# Patient Record
Sex: Male | Born: 1950 | Race: White | Hispanic: No | Marital: Married | Smoking: Former smoker
Health system: Southern US, Community
[De-identification: ages and names within clinical notes are randomized; demographics above are authoritative.]

## PROBLEM LIST (undated history)

## (undated) DIAGNOSIS — I2510 Atherosclerotic heart disease of native coronary artery without angina pectoris: Secondary | ICD-10-CM

## (undated) DIAGNOSIS — I10 Essential (primary) hypertension: Secondary | ICD-10-CM

## (undated) DIAGNOSIS — I739 Peripheral vascular disease, unspecified: Secondary | ICD-10-CM

## (undated) DIAGNOSIS — C801 Malignant (primary) neoplasm, unspecified: Secondary | ICD-10-CM

## (undated) DIAGNOSIS — B182 Chronic viral hepatitis C: Secondary | ICD-10-CM

## (undated) HISTORY — PX: PALATE / UVULA BIOPSY / EXCISION: SUR128

## (undated) HISTORY — PX: CAROTID ENDARTERECTOMY: SUR193

---

## 2009-06-06 ENCOUNTER — Ambulatory Visit: Payer: Self-pay | Admitting: Gastroenterology

## 2009-06-19 ENCOUNTER — Ambulatory Visit: Payer: Self-pay | Admitting: Cardiovascular Disease

## 2009-10-08 ENCOUNTER — Encounter: Payer: Self-pay | Admitting: Anesthesiology

## 2009-10-21 ENCOUNTER — Encounter: Payer: Self-pay | Admitting: Anesthesiology

## 2009-11-14 ENCOUNTER — Ambulatory Visit: Payer: Self-pay | Admitting: Gastroenterology

## 2010-01-24 ENCOUNTER — Inpatient Hospital Stay: Payer: Self-pay | Admitting: Internal Medicine

## 2014-01-04 ENCOUNTER — Ambulatory Visit: Payer: Self-pay | Admitting: Vascular Surgery

## 2014-01-04 DIAGNOSIS — I10 Essential (primary) hypertension: Secondary | ICD-10-CM

## 2014-01-04 LAB — BASIC METABOLIC PANEL
Anion Gap: 7 (ref 7–16)
BUN: 9 mg/dL (ref 7–18)
CALCIUM: 8.8 mg/dL (ref 8.5–10.1)
Chloride: 102 mmol/L (ref 98–107)
Co2: 25 mmol/L (ref 21–32)
Creatinine: 1.22 mg/dL (ref 0.60–1.30)
EGFR (Non-African Amer.): >60
GLUCOSE: 109 mg/dL — AB (ref 65–99)
Osmolality: 268 (ref 275–301)
POTASSIUM: 4.5 mmol/L (ref 3.5–5.1)
Sodium: 134 mmol/L — ABNORMAL LOW (ref 136–145)

## 2014-01-04 LAB — CBC
HCT: 42.6 % (ref 40.0–52.0)
HGB: 14.6 g/dL (ref 13.0–18.0)
MCH: 35.6 pg — ABNORMAL HIGH (ref 26.0–34.0)
MCHC: 34.3 g/dL (ref 32.0–36.0)
MCV: 104 fL — ABNORMAL HIGH (ref 80–100)
PLATELETS: 153 10*3/uL (ref 150–440)
RBC: 4.11 10*6/uL — ABNORMAL LOW (ref 4.40–5.90)
RDW: 14.4 % (ref 11.5–14.5)
WBC: 5.8 10*3/uL (ref 3.8–10.6)

## 2014-01-04 LAB — URINALYSIS, COMPLETE
BILIRUBIN, UR: NEGATIVE
BLOOD: NEGATIVE
GLUCOSE, UR: NEGATIVE mg/dL (ref 0–75)
Ketone: NEGATIVE
Leukocyte Esterase: NEGATIVE
Nitrite: NEGATIVE
Ph: 7 (ref 4.5–8.0)
SPECIFIC GRAVITY: 1.014 (ref 1.003–1.030)
WBC UR: 4 /HPF (ref 0–5)

## 2014-01-11 ENCOUNTER — Inpatient Hospital Stay: Payer: Self-pay | Admitting: Vascular Surgery

## 2014-01-12 LAB — CBC WITH DIFFERENTIAL/PLATELET
BASOS ABS: 0.1 10*3/uL (ref 0.0–0.1)
Basophil %: 0.8 %
EOS PCT: 4.9 %
Eosinophil #: 0.4 10*3/uL (ref 0.0–0.7)
HCT: 38.2 % — AB (ref 40.0–52.0)
HGB: 12.8 g/dL — ABNORMAL LOW (ref 13.0–18.0)
Lymphocyte #: 2.3 10*3/uL (ref 1.0–3.6)
Lymphocyte %: 30.6 %
MCH: 35.8 pg — ABNORMAL HIGH (ref 26.0–34.0)
MCHC: 33.5 g/dL (ref 32.0–36.0)
MCV: 107 fL — ABNORMAL HIGH (ref 80–100)
MONOS PCT: 10.6 %
Monocyte #: 0.8 x10 3/mm (ref 0.2–1.0)
NEUTROS ABS: 3.9 10*3/uL (ref 1.4–6.5)
Neutrophil %: 53.1 %
Platelet: 123 10*3/uL — ABNORMAL LOW (ref 150–440)
RBC: 3.58 10*6/uL — ABNORMAL LOW (ref 4.40–5.90)
RDW: 14.2 % (ref 11.5–14.5)
WBC: 7.4 10*3/uL (ref 3.8–10.6)

## 2014-01-12 LAB — BASIC METABOLIC PANEL
Anion Gap: 7 (ref 7–16)
BUN: 15 mg/dL (ref 7–18)
CALCIUM: 7.8 mg/dL — AB (ref 8.5–10.1)
CO2: 26 mmol/L (ref 21–32)
Chloride: 101 mmol/L (ref 98–107)
Creatinine: 1.64 mg/dL — ABNORMAL HIGH (ref 0.60–1.30)
EGFR (Non-African Amer.): 44 — ABNORMAL LOW
GFR CALC AF AMER: 51 — AB
GLUCOSE: 104 mg/dL — AB (ref 65–99)
OSMOLALITY: 269 (ref 275–301)
Potassium: 4 mmol/L (ref 3.5–5.1)
Sodium: 134 mmol/L — ABNORMAL LOW (ref 136–145)

## 2014-01-12 LAB — PATHOLOGY REPORT

## 2014-01-12 LAB — PROTIME-INR
INR: 1.3
PROTHROMBIN TIME: 15.8 s — AB (ref 11.5–14.7)

## 2014-01-12 LAB — APTT: Activated PTT: 31.8 secs (ref 23.6–35.9)

## 2014-05-10 ENCOUNTER — Observation Stay: Payer: Self-pay | Admitting: Internal Medicine

## 2014-05-10 LAB — COMPREHENSIVE METABOLIC PANEL
ALBUMIN: 2.9 g/dL — AB (ref 3.4–5.0)
ALK PHOS: 151 U/L — AB
Anion Gap: 8 (ref 7–16)
BUN: 9 mg/dL (ref 7–18)
Bilirubin,Total: 0.4 mg/dL (ref 0.2–1.0)
CHLORIDE: 109 mmol/L — AB (ref 98–107)
CO2: 22 mmol/L (ref 21–32)
CREATININE: 1.23 mg/dL (ref 0.60–1.30)
Calcium, Total: 7.9 mg/dL — ABNORMAL LOW (ref 8.5–10.1)
EGFR (African American): >60
EGFR (Non-African Amer.): >60
Glucose: 150 mg/dL — ABNORMAL HIGH (ref 65–99)
Osmolality: 279 (ref 275–301)
POTASSIUM: 4 mmol/L (ref 3.5–5.1)
SGOT(AST): 109 U/L — ABNORMAL HIGH (ref 15–37)
SGPT (ALT): 73 U/L — ABNORMAL HIGH
SODIUM: 139 mmol/L (ref 136–145)
TOTAL PROTEIN: 7.4 g/dL (ref 6.4–8.2)

## 2014-05-10 LAB — LIPID PANEL
CHOLESTEROL: 79 mg/dL (ref 0–200)
HDL: 28 mg/dL — AB (ref 40–60)
LDL CHOLESTEROL, CALC: 32 mg/dL (ref 0–100)
Triglycerides: 96 mg/dL (ref 0–200)
VLDL CHOLESTEROL, CALC: 19 mg/dL (ref 5–40)

## 2014-05-10 LAB — PROTIME-INR
INR: 1.3
Prothrombin Time: 15.9 secs — ABNORMAL HIGH (ref 11.5–14.7)

## 2014-05-10 LAB — LIPASE, BLOOD: LIPASE: 328 U/L (ref 73–393)

## 2014-05-10 LAB — URINALYSIS, COMPLETE
BACTERIA: NONE SEEN
BLOOD: NEGATIVE
Bilirubin,UR: NEGATIVE
GLUCOSE, UR: NEGATIVE mg/dL (ref 0–75)
Ketone: NEGATIVE
Leukocyte Esterase: NEGATIVE
NITRITE: NEGATIVE
PH: 5 (ref 4.5–8.0)
Protein: NEGATIVE
RBC,UR: 1 /HPF (ref 0–5)
Specific Gravity: 1.018 (ref 1.003–1.030)
Squamous Epithelial: <1
WBC UR: 1 /HPF (ref 0–5)

## 2014-05-10 LAB — CBC WITH DIFFERENTIAL/PLATELET
BASOS ABS: 0.1 10*3/uL (ref 0.0–0.1)
Basophil %: 2.1 %
Eosinophil #: 0.4 10*3/uL (ref 0.0–0.7)
Eosinophil %: 9.5 %
HCT: 29.6 % — AB (ref 40.0–52.0)
HGB: 9.9 g/dL — AB (ref 13.0–18.0)
Lymphocyte #: 1.3 10*3/uL (ref 1.0–3.6)
Lymphocyte %: 31.5 %
MCH: 33.6 pg (ref 26.0–34.0)
MCHC: 33.5 g/dL (ref 32.0–36.0)
MCV: 100 fL (ref 80–100)
Monocyte #: 0.5 x10 3/mm (ref 0.2–1.0)
Monocyte %: 11.4 %
NEUTROS ABS: 1.9 10*3/uL (ref 1.4–6.5)
Neutrophil %: 45.5 %
Platelet: 164 10*3/uL (ref 150–440)
RBC: 2.96 10*6/uL — ABNORMAL LOW (ref 4.40–5.90)
RDW: 13.5 % (ref 11.5–14.5)
WBC: 4.2 10*3/uL (ref 3.8–10.6)

## 2014-05-10 LAB — D-DIMER(ARMC): D-DIMER: 609 ng/mL

## 2014-05-10 LAB — HEPARIN LEVEL (UNFRACTIONATED): Anti-Xa(Unfractionated): <0.1 IU/mL — ABNORMAL LOW (ref 0.30–0.70)

## 2014-05-10 LAB — CK-MB
CK-MB: 2.1 ng/mL (ref 0.5–3.6)
CK-MB: 2.2 ng/mL (ref 0.5–3.6)

## 2014-05-10 LAB — TROPONIN I
TROPONIN-I: 0.05 ng/mL
Troponin-I: 0.03 ng/mL
Troponin-I: 0.03 ng/mL

## 2014-05-10 LAB — ETHANOL: Ethanol: 77 mg/dL

## 2014-05-10 LAB — CK TOTAL AND CKMB (NOT AT ARMC)
CK, Total: 103 U/L
CK-MB: 1.6 ng/mL (ref 0.5–3.6)

## 2014-05-10 LAB — APTT: ACTIVATED PTT: 32.3 s (ref 23.6–35.9)

## 2014-06-08 ENCOUNTER — Ambulatory Visit
Admit: 2014-06-08 | Disposition: A | Discharge status: Home / Self Care | Payer: Self-pay | Admitting: Unknown Physician Specialty

## 2014-06-27 ENCOUNTER — Ambulatory Visit: Payer: Self-pay | Admitting: Unknown Physician Specialty

## 2014-09-19 ENCOUNTER — Ambulatory Visit: Payer: Self-pay | Admitting: Gastroenterology

## 2014-10-03 ENCOUNTER — Ambulatory Visit
Admit: 2014-10-03 | Disposition: A | Discharge status: Home / Self Care | Payer: Self-pay | Attending: Gastroenterology | Admitting: Gastroenterology

## 2014-10-09 ENCOUNTER — Ambulatory Visit
Admit: 2014-10-09 | Disposition: A | Discharge status: Home / Self Care | Payer: Self-pay | Attending: Vascular Surgery | Admitting: Vascular Surgery

## 2014-10-09 LAB — CREATININE, SERUM
Creatinine: 0.93 mg/dL
EGFR (African American): >60

## 2014-10-09 LAB — BUN: BUN: 12 mg/dL

## 2014-10-14 NOTE — Consult Note (Signed)
PATIENT NAME:  Jay Dougherty, Jay Dougherty MR#:  [id number] DATE OF BIRTH:  May 15, 1951  DATE OF CONSULTATION:  01/12/2014  REFERRING PHYSICIAN:   CONSULTING PHYSICIAN:  Otelia Limes. Yves Dill, MD  REQUESTING PHYSICIAN: Dr. Lucky Cowboy.  REASON FOR CONSULTATION: Urinary retention.   HISTORY OF PRESENT ILLNESS: Mr. Prude is a 64 year old, Caucasian Jay Dougherty, who underwent right carotid endarterectomy yesterday. Apparently during the procedure, he had a Foley catheter in place. He was not able to void and the catheter was replaced last night. Earlier today, he was not able to void and the catheter was replaced and removed. Prior to the current hospitalization, the patient does give a long history of difficulty voiding with a weak stream, straining, and nocturia. He has not had a recent DRE or PSA. He is currently not taking any medications for BPH.   ALLERGIES: No drug allergies.   CURRENT MEDICATIONS: Included aspirin, metoprolol, Colace, losartan, simvastatin, pantoprazole, diphenhydramine, Plavix, Nitrostat p.r.n., and Percocet.   PAST SURGICAL HISTORY:   1. Removal of uvula.  2. Coronary artery stent placement x 1.   SOCIAL HISTORY: The patient smokes half-a-pack-a-day and has greater than a 30-pack-year history. He is a recovered alcoholic. He quit drinking in 2011.   FAMILY HISTORY: Remarkable for heart disease and Alzheimer dementia.   PAST AND CURRENT MEDICAL CONDITIONS:  1. Coronary artery disease.  2. GERD. 3. Vascular disease.  4. Hypercholesterolemia.  5. Hypertension.   REVIEW OF SYSTEMS: The patient denies history of prior urological surgery, kidney stones or urinary tract infections.   PHYSICAL EXAMINATION:  GENERAL: Well-nourished white Jay Dougherty in no acute distress.  GASTROINTESTINAL: Abdomen was soft. Bladder was slightly distended.  GENITOURINARY: Circumcised. Testes were smooth and nontender, 18 mL size each.  RECTAL: 40-gram smooth nontender prostate.   PERTINENT LABORATORY STUDIES:  Include BUN of 15 and a creatinine of 1.64.   Bladder scan at 4 p.m. today indicated 200 mL present in the bladder.   IMPRESSION:  1. Postoperative urinary retention.  2. Benign prostatic hypertrophy.   PLAN:  1. Discussed options of leaving the Foley catheter in for 1 week versus intermittent self-catheterization. After a lengthy discussion with the patient and his wife, he has opted for self-catheterization. I brought in 12 self-catheters and instructed the patient and the wife in how to perform the procedure. Also provided written instructions and a catheterization-void diary to keep track of his progress.  2. Flomax 0.4 mg after supper. 3. Uribel 1 every 6 hours as needed for dysuria.  4. Drink plenty of fluids.  5. Return to the office in 1 week for follow-up.   ____________________________ Otelia Limes. Yves Dill, MD mrw:jr D: 01/12/2014 18:37:54 ET T: 01/12/2014 19:31:24 ET JOB#: [id number]  cc: Otelia Limes. Yves Dill, MD, <Dictator> Royston Cowper MD ELECTRONICALLY SIGNED 01/13/2014 8:29

## 2014-10-14 NOTE — Op Note (Signed)
PATIENT NAME:  Jay Dougherty, Jay Dougherty MR#:  [id number] DATE OF BIRTH:  Dec 21, 1950  DATE OF PROCEDURE:  01/11/2014  PREOPERATIVE DIAGNOSES:  1.  High-grade right carotid artery stenosis.  2.  Syncope.  3.  Coronary disease.  4.  Hypertension.  5.  Hepatitis C.   POSTOPERATIVE DIAGNOSES: 1.  High-grade right carotid artery stenosis.  2.  Syncope.  3.  Coronary disease.  4.  Hypertension.  5.  Hepatitis C.   PROCEDURE:  Right carotid endarterectomy with CorMatrix arterial patch reconstruction.   SURGEON: Leotis Pain, MD.  ASSISTANT:  Marta Lamas, MD.  ANESTHESIA: General.   ESTIMATED BLOOD LOSS: Approximately 100 mL.   INDICATION FOR PROCEDURE: This is a 64 year old gentleman with high-grade right carotid artery stenosis who has had recurrent syncopal episodes. He has had a cardiac work-up and previous cardiac treatment.  His cardiologist felt that his most likely cause of his syncope was his carotid disease. CT angiogram and ultrasound showed high-grade right carotid artery stenosis. He is brought in for repair. Risks and benefits were discussed. Informed consent was obtained.   DESCRIPTION OF PROCEDURE: The patient is brought to the operative suite and after an adequate level of general anesthesia obtained, the patient was placed in modified beach chair position. A roll was placed under her shoulders.  Neck was flexed and turned to the left. The skin was sterilely prepped and draped and a sterile surgical field was created. The incision was created along the anterior border of the sternocleidomastoid and I dissected down through the platysma with electrocautery. Two [place] retractors used to help facilitate our exposure. The carotid artery was dissected out.  The common carotid artery was a generous vessel.  This was encircled with vessel loops after the facial vein was ligated and divided between silk ties. The disease tracked a couple of centimeters beyond the bifurcation and I dissected out the  hypoglossal nerve protecting it from harm and ligated an artery and a vein branch in this area, dividing them between silk ties.  We were able then to dissect out the distal internal carotid artery beyond the lesion and encircled this with vessel loops. The superior thyroid artery and external carotid artery were encircled with vessel loops as well.  The patient was given 6000 units of intravenous heparin for systemic anticoagulation and control was then pulled up on the vessel loops and anterior wall arteriotomy was created with an 11 blade and extended with Potts scissors. The Pruitt-Inahara shunt was placed first, and the internal carotid artery flushed and de-aired, then in the common carotid artery flushed and de-aired, and then flow was restored to the brain.  Approximately two minutes elapsed from clamping to shunt placement. An endarterectomy was then performed in the typical fashion. The proximal endpoint was cut flush with tenotomy scissors. An eversion endarterectomy was performed on the external carotid artery. A nice feathered distal endpoint was created on the internal carotid artery. All loose flecks were removed and the vessel was locally heparinized. The CorMatrix external patch was then brought onto the field. It was cut and beveled distally and started at the proximal endpoint and run approximately 1/2 the length of the arteriotomy medially and laterally.  The patch was then cut and beveled to an appropriate length and a second 6-0 Prolene was started at the proximal endpoint. This was run medially first and tied to the other suture line. The lateral suture line was run until the shunt needed to be removed. Shunt was then removed. The  vessel was flushed in the internal, external and common carotid arteries and locally heparinized, and then the anastomosis was completed.  Several cardiac cycles allowed to traverse up the external carotid artery prior to releasing control and the vessel was flushed  and de-aired prior to release of control. Approximately 2 minutes elapsed from shunt removal to restoration of flow to the brain. Three 6-0 Prolene patches were used for hemostasis. Hemostasis was achieved. Evicel and Surgicel were placed. The wound was then closed with three 3-0 Vicryl sutures in the sternocleidomastoid space. The platysma was closed with a running 3-0 Vicryl and the skin was closed with 4-0 Monocryl. Dermabond was placed as the dressing. The patient was awakened from anesthesia and taken to the recovery room in stable condition having tolerated the procedure well.      ____________________________ Algernon Huxley, MD jsd:ts D: 01/11/2014 15:38:04 ET T: 01/11/2014 16:10:26 ET JOB#: [id number]  cc: Algernon Huxley, MD, <Dictator> Dionisio David, MD Doylene Bode, MD Algernon Huxley MD ELECTRONICALLY SIGNED 01/31/2014 14:10

## 2014-10-14 NOTE — Consult Note (Signed)
Pt seen and examined. Full consult to follow. Known hx of CAD, s/p cardiac  stent. On daily PPI and plavix since 2003. At one point, was diagnosed with Barrett's. However, last EGD by Dr. Dionne Milo in 2011 showed No evidence of Barrett's. Woke up from sleep last night with severe leg and chest pain. According to wife, more abdominal bloating and indigestion recently despite taking either protonix or dexilant. Cardiac cath done today showed no progression of his cardiac disease. Also, hx of chronic hepatitis C. Recalls trying triple therapy with interferon. Got violently sick and had to stop after a dose or two. Went to [facility] for referral. Told to check labs 1-2 per year and hold off on any treatment.  increasing protonix to bid. Either protonix bid or dexilant/ prevacid bid once discharged. Will need repeat EGD over the next month or so as outpt off plavix if no clinical improvement. Pt made aware that there are new Hep C treatments available that does not include interferon. That can be considered later as well if insurance approves. Will check back tomorrow. Thanks.  Electronic Signatures: Verdie Shire (MD)  (Signed on [phone] 16:25)  Authored  Last Updated: 08-QPY-19 16:25 by Verdie Shire (MD)

## 2014-10-14 NOTE — Consult Note (Signed)
PATIENT NAME:  Jay Dougherty, Jay Dougherty MR#:  [id number] DATE OF BIRTH:  Sep 13, 1950  DATE OF CONSULTATION:  05/10/2014  REFERRING PHYSICIAN:   CONSULTING PHYSICIAN:  Kelby Fam. Ewing Schlein, MD  INDICATION FOR CONSULT: Unstable angina.   HISTORY OF PRESENT ILLNESS: This is a 64 year old Caucasian male, well known to our practice with a past medical history of coronary artery disease, status post PCI, peripheral vascular disease, status post right CEA this year, hypertension, Barrett esophagus, and Hepatitis C.   The patient states chest pain started last night. He had severe leg pain secondary to his peripheral vascular disease, which awoke him, which was followed by severe chest pain and patient collapsed. He denies loss of consciousness, but was unable to move after he collapsed. He describes pain as substernal and left-sided pressure-type, with concurrent nausea and shortness of breath. He is still having severe chest pain this morning.   PAST MEDICAL HISTORY: Coronary artery disease status post PCI to LAD in [place] in 2003, peripheral vascular disease status post right CEA in July 2015, and he also has lower extremity peripheral vascular disease, hypertension, Barrett esophagus, history of Hepatitis C.   HOME MEDICATIONS: Aspirin 81 mg, Cozaar 50 mg, Lipitor 80 mg, metoprolol succinate 50 mg, Nitrostat 0.4 mg p.r.n. chest pain, pantoprazole 40 mg daily, Plavix 75 mg daily, Pletal 100 mg b.i.d.   SOCIAL HISTORY: Former tobacco user. Social alcohol use. No other illicit drug use. Married. Works as occupation Animal nutritionist.   ALLERGIES: No known drug allergies.   FAMILY HISTORY: Father had coronary artery disease.   REVIEW OF SYSTEMS:  SYSTEMIC: Positive for fatigue and malaise.  CARDIOVASCULAR: Positive for chest pain. No palpitations or tachycardia.  PULMONARY: Positive for shortness of breath. No cough or wheezing.  GASTROINTESTINAL: Positive for feeling bloated, but  denies pain.   PHYSICAL EXAMINATION:  VITAL SIGNS: Blood pressure 190/82, pulse 92, temperature 98.3 degrees, respirations 17, pulse oximetry 94% saturation on room air.  GENERAL: Overall, patient is A and O x 3, in moderate distress.  LUNGS: Clear to auscultation bilaterally. No wheezes, rhonchi or rales.  CARDIOVASCULAR: Normal S1, S2. No audible murmur.  ABDOMEN: Nontender. Positive bowel sounds.  EXTREMITIES: No pedal edema.   LABORATORY DATA: Creatinine 1.23. First troponin was 0.03. White blood cells 4.2, platelets 164,000. INR 1.3.   EKG shows sinus tachycardia, 103 beats per minute, with preventricular contractions and left atrial enlargement.   ASSESSMENT AND PLAN: The patient with known coronary artery disease and unstable angina. He did have a CTA of his coronary early this year that showed the stent in the LAD was patent, but had moderate to severe stenosis distal to the LAD stent. Left circumflex had moderate calcified, diffuse disease. Calcium score was 2606. Advise continuing aspirin, Plavix, beta blocker and statin. Await echo results. Plan for cardiac catheterization today.   Thank you very much for this consult.    DICTATED FOR: Dionisio David, MD   ____________________________ Kelby Fam Dorena Cookey, PA-C ear:MT D: 05/10/2014 10:37:00 ET T: 05/10/2014 11:00:59 ET JOB#: [id number]  Dyann Ruddle A Mekhia Brogan PA ELECTRONICALLY SIGNED 05/27/2014 16:46

## 2014-10-14 NOTE — Consult Note (Signed)
Brief Consult Note: Diagnosis: Post-op urinary retention. BPH.   Patient was seen by consultant.   Consult note dictated.   Recommend to proceed with surgery or procedure.   Recommend further assessment or treatment.   Orders entered.   Comments: Flomax. Uribel. CIC tid and void/cath diary. F/U in office in one week.  Electronic Signatures: Royston Cowper (MD)  (Signed 23-Jul-15 18:25)  Authored: Brief Consult Note   Last Updated: 23-Jul-15 18:25 by Royston Cowper (MD)

## 2014-10-14 NOTE — Consult Note (Signed)
PATIENT NAME:  Jay Dougherty, Jay Dougherty MR#:  [id number] DATE OF BIRTH:  [birth year]  DATE OF CONSULTATION:  05/10/2014  REFERRING PHYSICIAN:   CONSULTING PHYSICIAN:  Lupita Dawn. Zebadiah Willert, MD  REASON FOR REFERRAL: Noncardiac chest pain.   HISTORY OF PRESENT ILLNESS: The patient is a 64 year old, white male, with a known history of coronary artery disease, who had a cardiac stent placed in 2003. He also has a known history of reflux disease and history of Barrett esophagus. He has been on a daily proton pump inhibitor and Plavix ever since 2003. One time, he was on Nexium. Recently he has been on Protonix, although the wife mentioned, he might be on Dexilant. He has had a couple upper endoscopies done in the past; however, the last upper endoscopy done by Dr. Dionne Milo in 2011 showed no evidence of Barrett esophagus.   He was doing fairly well until he woke up from sleep last night with rather severe leg pain, then followed by chest pain. As a result, he came to the Emergency Room for further evaluation. According to the wife, he has been having more abdominal bloating and indigestion symptoms recently despite taking the reflux medication. Fortunately, the cardiac catheterization done today showed no progression of his cardiac disease. As a result, I was asked to see the patient.   He also has a history of chronic hepatitis C. He recalls trying triple therapy with interferon. Unfortunately, even after 1 or 2 doses, he got violently sick to the point that he had to be admitted to the hospital. He then went to [place] and saw Dr. Maceo Pro. At the time, he was told to just hold off on treatment, but to have laboratories checked once to twice a year.   MEDICATIONS HE WAS ON  INCLUDE: Imdur, Toprol, nitroglycerin ointment, Protonix once a day, Zocor and baby aspirin, as well as Plavix. Home medication also includes Pletal 100 mg twice a day.   OTHER PAST MEDICAL HISTORY:  1. Stent placement in 2003. 2. Peripheral  vascular disease.   SOCIAL HISTORY: He used to smoke but quit. He drinks alcohol occasionally.   ALLERGIES: He has no known drug allergies.   FAMILY HISTORY: Coronary artery disease.   REVIEW OF SYSTEMS: Please refer to the initial history and physical. There are no changes.   PHYSICAL EXAMINATION:  GENERAL: The patient appears to be in no acute distress right now.  HEAD AND NECK: Within normal limits.  CARDIAC: Regular rate.  LUNGS: Clear bilaterally.  ABDOMEN: Normoactive bowel sounds, soft and nontender. There is no hepatomegaly.  EXTREMITIES: Showed no clubbing, cyanosis or edema.   LABORATORY DATA: Electrolytes are all normal. Glucose 150, lipase was 328, alcohol level was  77. Liver enzymes showed alkaline phosphatase 151, AST 109, ALT 73. CPK enzymes were normal. Hemoglobin 9.9. White count is 4.2.   IMPRESSION: This is a patient with acute chest pain, which appears to be noncardiac in nature. It could be related to worsening of the reflux disease. I recommended that he would go on Protonix twice a day with Dexilant twice a day when he gets discharged. He will need a repeat upper endoscopy, but it will need to be scheduled in the next few weeks as an outpatient. He will need to be off the Plavix before we can do this safely. We will need to keep an eye on his liver enzymes to make sure there is nothing wrong with his gallbladder or liver that could have contributed to the pain.  Thank you for the referral.    ____________________________ Lupita Dawn. Candace Cruise, MD pyo:JT D: 05/11/2014 11:12:12 ET T: 05/11/2014 11:44:24 ET JOB#: [id number]  cc: Lupita Dawn. Candace Cruise, MD, <Dictator> Lupita Dawn Paraskevi Funez MD ELECTRONICALLY SIGNED 05/11/2014 14:55

## 2014-10-16 LAB — SURGICAL PATHOLOGY

## 2014-10-22 NOTE — Op Note (Signed)
PATIENT NAME:  Jay Dougherty, Jay Dougherty MR#:  [id number] DATE OF BIRTH:  [birth year]  DATE OF PROCEDURE:  10/09/2014  PREOPERATIVE DIAGNOSES:  1.  Ischemic colitis.  2.  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  3.  Peripheral arterial disease with claudication.  4.  Tobacco dependence.  5.  Coronary disease.  6.  Hypertension.  7.  Carotid artery stenosis.   POSTOPERATIVE DIAGNOSIS:  1.  Ischemic colitis.  2.  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  3.  Peripheral arterial disease with claudication.  4.  Tobacco dependence.  5.  Coronary disease.  6.  Hypertension.  7.  Carotid artery stenosis.   PROCEDURES:  1.  Ultrasound guidance for vascular access to right femoral artery.  2.  Catheter placement into celiac, superior mesenteric artery, and inferior mesenteric artery from right femoral approach.  3.  Aortogram and selective celiac, superior mesenteric artery, and inferior mesenteric artery arteriograms.  4.  Percutaneous transluminal angioplasty of the inferior mesenteric artery with a 4 mm diameter angioplasty balloon.  5.  StarClose closure device right femoral artery.   SURGEON:  Algernon Huxley, M.D.   ANESTHESIA:  Local with moderate conscious sedation.   ESTIMATED BLOOD LOSS:  25 mL   CONTRAST USED:  80 mL of Visipaque.   INDICATION FOR PROCEDURE:  This is a 64 year old gentleman with a long history of atherosclerotic disease on many medications.  He has already had carotid endarterectomy.  He has coronary disease.  He has lower extremity peripheral arterial disease.  He had a severe bout of left-sided and transverse colitis.  This was felt to be likely ischemic in nature.  A CT scan was performed which I have reviewed.  This was a limited study and not fully able to evaluate the visceral  vessels, but there appeared to be significant atherosclerotic calcification of the celiac and SMA, as well as a small IMA which appeared narrowed on the CT scan.  Again, this was a somewhat limited study, but did get a suggestion that large vessel vascular disease may be present.  For this reason, an angiogram was performed for further evaluation for diagnosis as well as potential treatment if significant large vessel disease was identified.  Risks and benefits were discussed.  Informed consent was obtained.   DESCRIPTION OF PROCEDURE:  The patient is brought to the vascular suite.   His groins were sterilely prepped and draped and a sterile surgical field was created.  The right femoral artery was visualized with ultrasound and found to be widely patent.  Although quite calcific, it was accessed under direct ultrasound guidance without difficulty with a Seldinger needle and a permanent image was recorded.  A J-wire and 5-French sheath were then placed.  The patient was given a small dose of intravenous heparin.  Pigtail catheter was placed in the aorta at the T12-L1 level initially and an AP and lateral projection aortogram was performed.  I then used a C2 catheter to initially cannulate the celiac artery and perform AP and lateral imaging as well as cannulating the SMA with the C2 catheter and lateral and an AP projections were performed.  These demonstrated calcific lesions of the celiac and superior mesenteric artery without significant stenosis of more than 20% in either vessel with brisk flow through the normal vascular branches of both the celiac and superior mesenteric  artery.  No intervention was required on these lesions, and after cannulating both vessels and imaging them thoroughly, we turned our attention to evaluating the IMA.  A steep RAO projection aortogram was performed with pigtail catheter replaced in the infrarenal aorta.  This did show what appeared to be at least a moderate stenosis in the  first 2 to 3 cm of the IMA before its primary branches.  On the original aortogram, this appeared to be in the 60 to 70% range.  A VS-1 catheter was used to then selectively cannulate the IMA and selective imaging was performed confirming this at least  moderate degree of stenosis that I would estimate in the 65 to 70% range.  With this in place and with a history of ischemic colitis, I felt treatment was appropriate.  We had already imaged all 3 visceral vessels and this was the only significant lesion of the 3.  A V-18 wire was parked in the distal IMA well beyond the primary and secondary branches.  Over this wire, a 4 mm diameter x 4 cm length angioplasty balloon was taken out the proximal IMA with the back portion of the balloon just into the aorta.  The balloon was inflated to approximately 8 to 10 atmospheres.  A waste was seen which resolved with angioplasty.  The balloon inflation was held for approximately 1 minute.  The balloon was then removed.  A 4-French glide catheter was then placed back in the proximal IMA and completion imaging was performed which showed significant improvement with less than 20% residual stenosis after angioplasty and brisk flow through the IMA branches.  At this point I elected to terminate the procedure.  The diagnostic catheter was removed.  Oblique arteriogram was performed of the right femoral artery and StarClose closure device was deployed in the usual fashion with excellent hemostatic result.  The patient tolerated the procedure well and was taken to the recovery room in stable condition.    ____________________________ Algernon Huxley, MD [id number] D: 10/09/2014 15:27:32 ET T: 10/09/2014 19:19:15 ET JOB#: [id number]  cc: Algernon Huxley, MD, <Dictator> Venetia Maxon. Elijio Miles, MD Algernon Huxley MD ELECTRONICALLY SIGNED 10/12/2014 12:54

## 2015-09-18 ENCOUNTER — Other Ambulatory Visit: Payer: Self-pay | Admitting: Vascular Surgery

## 2015-09-20 ENCOUNTER — Other Ambulatory Visit
Admission: RE | Admit: 2015-09-20 | Discharge: 2015-09-20 | Disposition: A | Discharge status: Home / Self Care | Payer: 59 | Source: Ambulatory Visit | Attending: Vascular Surgery | Admitting: Vascular Surgery

## 2015-09-20 DIAGNOSIS — I739 Peripheral vascular disease, unspecified: Secondary | ICD-10-CM | POA: Insufficient documentation

## 2015-09-20 DIAGNOSIS — Z01812 Encounter for preprocedural laboratory examination: Secondary | ICD-10-CM | POA: Diagnosis present

## 2015-09-20 LAB — CREATININE, SERUM
CREATININE: 1.26 mg/dL — AB (ref 0.61–1.24)
GFR calc Af Amer: >60 mL/min (ref >60)
GFR calc non Af Amer: 58 mL/min — ABNORMAL LOW (ref >60)

## 2015-09-20 LAB — BUN: BUN: 15 mg/dL (ref 6–20)

## 2015-09-24 ENCOUNTER — Encounter: Payer: Self-pay | Admitting: *Deleted

## 2015-09-24 ENCOUNTER — Ambulatory Visit
Admission: RE | Admit: 2015-09-24 | Discharge: 2015-09-24 | Disposition: A | Discharge status: Home / Self Care | Payer: 59 | Source: Ambulatory Visit | Attending: Vascular Surgery | Admitting: Vascular Surgery

## 2015-09-24 ENCOUNTER — Encounter
Admission: RE | Disposition: A | Discharge status: Home / Self Care | Payer: Self-pay | Source: Ambulatory Visit | Attending: Vascular Surgery

## 2015-09-24 DIAGNOSIS — I10 Essential (primary) hypertension: Secondary | ICD-10-CM | POA: Insufficient documentation

## 2015-09-24 DIAGNOSIS — F17200 Nicotine dependence, unspecified, uncomplicated: Secondary | ICD-10-CM | POA: Diagnosis not present

## 2015-09-24 DIAGNOSIS — I771 Stricture of artery: Secondary | ICD-10-CM | POA: Insufficient documentation

## 2015-09-24 DIAGNOSIS — E785 Hyperlipidemia, unspecified: Secondary | ICD-10-CM | POA: Diagnosis not present

## 2015-09-24 DIAGNOSIS — Z8249 Family history of ischemic heart disease and other diseases of the circulatory system: Secondary | ICD-10-CM | POA: Insufficient documentation

## 2015-09-24 DIAGNOSIS — K559 Vascular disorder of intestine, unspecified: Secondary | ICD-10-CM | POA: Diagnosis not present

## 2015-09-24 DIAGNOSIS — Z823 Family history of stroke: Secondary | ICD-10-CM | POA: Insufficient documentation

## 2015-09-24 DIAGNOSIS — I2510 Atherosclerotic heart disease of native coronary artery without angina pectoris: Secondary | ICD-10-CM | POA: Diagnosis not present

## 2015-09-24 DIAGNOSIS — Z8342 Family history of familial hypercholesterolemia: Secondary | ICD-10-CM | POA: Diagnosis not present

## 2015-09-24 DIAGNOSIS — I6523 Occlusion and stenosis of bilateral carotid arteries: Secondary | ICD-10-CM | POA: Insufficient documentation

## 2015-09-24 DIAGNOSIS — Z7982 Long term (current) use of aspirin: Secondary | ICD-10-CM | POA: Insufficient documentation

## 2015-09-24 DIAGNOSIS — I739 Peripheral vascular disease, unspecified: Secondary | ICD-10-CM | POA: Diagnosis not present

## 2015-09-24 DIAGNOSIS — I70218 Atherosclerosis of native arteries of extremities with intermittent claudication, other extremity: Secondary | ICD-10-CM | POA: Insufficient documentation

## 2015-09-24 DIAGNOSIS — Z955 Presence of coronary angioplasty implant and graft: Secondary | ICD-10-CM | POA: Insufficient documentation

## 2015-09-24 DIAGNOSIS — Z79899 Other long term (current) drug therapy: Secondary | ICD-10-CM | POA: Diagnosis not present

## 2015-09-24 DIAGNOSIS — M79609 Pain in unspecified limb: Secondary | ICD-10-CM | POA: Diagnosis not present

## 2015-09-24 DIAGNOSIS — B1920 Unspecified viral hepatitis C without hepatic coma: Secondary | ICD-10-CM | POA: Diagnosis not present

## 2015-09-24 DIAGNOSIS — I70219 Atherosclerosis of native arteries of extremities with intermittent claudication, unspecified extremity: Secondary | ICD-10-CM | POA: Insufficient documentation

## 2015-09-24 HISTORY — PX: PERIPHERAL VASCULAR CATHETERIZATION: SHX172C

## 2015-09-24 HISTORY — DX: Peripheral vascular disease, unspecified: I73.9

## 2015-09-24 HISTORY — DX: Essential (primary) hypertension: I10

## 2015-09-24 HISTORY — DX: Atherosclerotic heart disease of native coronary artery without angina pectoris: I25.10

## 2015-09-24 SURGERY — ABDOMINAL AORTOGRAM W/LOWER EXTREMITY
Site: Leg Lower | Wound class: Clean

## 2015-09-24 MED ORDER — IOPAMIDOL (ISOVUE-300) INJECTION 61%
INTRAVENOUS | Status: DC | PRN
  Administered 2015-09-24: 110 mL via INTRA_ARTERIAL

## 2015-09-24 MED ORDER — LABETALOL HCL 5 MG/ML IV SOLN
10.0000 mg | INTRAVENOUS | Status: DC | PRN
Start: 2015-09-24 — End: 2015-09-24

## 2015-09-24 MED ORDER — SODIUM CHLORIDE 0.9 % IV SOLN
INTRAVENOUS | Status: DC
  Administered 2015-09-24: 08:00:00 via INTRAVENOUS

## 2015-09-24 MED ORDER — GUAIFENESIN-DM 100-10 MG/5ML PO SYRP
15.0000 mL | ORAL_SOLUTION | ORAL | Status: DC | PRN
Start: 2015-09-24 — End: 2015-09-24
  Filled 2015-09-24: qty 15

## 2015-09-24 MED ORDER — HYDRALAZINE HCL 20 MG/ML IJ SOLN
5.0000 mg | INTRAMUSCULAR | Status: DC | PRN
Start: 2015-09-24 — End: 2015-09-24

## 2015-09-24 MED ORDER — FENTANYL CITRATE (PF) 100 MCG/2ML IJ SOLN
INTRAMUSCULAR | Status: AC
  Filled 2015-09-24: qty 2

## 2015-09-24 MED ORDER — MIDAZOLAM HCL 2 MG/2ML IJ SOLN
INTRAMUSCULAR | Status: DC | PRN
  Administered 2015-09-24: 2 mg via INTRAVENOUS
  Administered 2015-09-24 (×2): 1 mg via INTRAVENOUS

## 2015-09-24 MED ORDER — DEXTROSE 5 % IV SOLN
1.5000 g | INTRAVENOUS | Status: AC
  Administered 2015-09-24: 1.5 g via INTRAVENOUS

## 2015-09-24 MED ORDER — HYDROMORPHONE HCL 1 MG/ML IJ SOLN: 0.5000 mg | INTRAMUSCULAR | Status: DC | PRN

## 2015-09-24 MED ORDER — LIDOCAINE-EPINEPHRINE (PF) 1 %-1:200000 IJ SOLN
INTRAMUSCULAR | Status: AC
  Filled 2015-09-24: qty 30

## 2015-09-24 MED ORDER — ONDANSETRON HCL 4 MG/2ML IJ SOLN: 4.0000 mg | Freq: Four times a day (QID) | INTRAMUSCULAR | Status: DC | PRN

## 2015-09-24 MED ORDER — HEPARIN (PORCINE) IN NACL 2-0.9 UNIT/ML-% IJ SOLN
INTRAMUSCULAR | Status: AC
  Filled 2015-09-24: qty 1000

## 2015-09-24 MED ORDER — FAMOTIDINE 20 MG PO TABS: 40.0000 mg | ORAL_TABLET | ORAL | Status: DC | PRN

## 2015-09-24 MED ORDER — HYDROMORPHONE HCL 1 MG/ML IJ SOLN: 1.0000 mg | Freq: Once | INTRAMUSCULAR | Status: DC

## 2015-09-24 MED ORDER — MIDAZOLAM HCL 5 MG/5ML IJ SOLN
INTRAMUSCULAR | Status: AC
  Filled 2015-09-24: qty 5

## 2015-09-24 MED ORDER — METHYLPREDNISOLONE SODIUM SUCC 125 MG IJ SOLR
125.0000 mg | INTRAMUSCULAR | Status: DC | PRN
Start: 2015-09-24 — End: 2015-09-24

## 2015-09-24 MED ORDER — METOPROLOL TARTRATE 1 MG/ML IV SOLN
2.0000 mg | INTRAVENOUS | Status: DC | PRN
Start: 2015-09-24 — End: 2015-09-24

## 2015-09-24 MED ORDER — DEXTROSE 5 % IV SOLN
INTRAVENOUS | Status: AC
  Filled 2015-09-24 (×37): qty 1.5

## 2015-09-24 MED ORDER — FENTANYL CITRATE (PF) 100 MCG/2ML IJ SOLN
INTRAMUSCULAR | Status: DC | PRN
  Administered 2015-09-24 (×3): 50 ug via INTRAVENOUS

## 2015-09-24 MED ORDER — LIDOCAINE-EPINEPHRINE (PF) 1 %-1:200000 IJ SOLN
INTRAMUSCULAR | Status: DC | PRN
  Administered 2015-09-24: 10 mL via INTRADERMAL

## 2015-09-24 MED ORDER — PHENOL 1.4 % MT LIQD: 1.0000 | OROMUCOSAL | Status: DC | PRN

## 2015-09-24 MED ORDER — HYDRALAZINE HCL 20 MG/ML IJ SOLN
INTRAMUSCULAR | Status: DC | PRN
  Administered 2015-09-24: 20 mg via INTRAVENOUS

## 2015-09-24 MED ORDER — ACETAMINOPHEN 325 MG RE SUPP
325.0000 mg | RECTAL | Status: DC | PRN
  Filled 2015-09-24: qty 2

## 2015-09-24 MED ORDER — HEPARIN SODIUM (PORCINE) 1000 UNIT/ML IJ SOLN
INTRAMUSCULAR | Status: DC | PRN
  Administered 2015-09-24: 4000 [IU] via INTRAVENOUS

## 2015-09-24 MED ORDER — ACETAMINOPHEN 325 MG PO TABS: 325.0000 mg | ORAL_TABLET | ORAL | Status: DC | PRN

## 2015-09-24 MED ORDER — SODIUM CHLORIDE 0.9 % IV SOLN: 500.0000 mL | Freq: Once | INTRAVENOUS | Status: DC | PRN

## 2015-09-24 MED ORDER — OXYCODONE-ACETAMINOPHEN 5-325 MG PO TABS: 1.0000 | ORAL_TABLET | ORAL | Status: DC | PRN

## 2015-09-24 MED ORDER — HYDRALAZINE HCL 20 MG/ML IJ SOLN
INTRAMUSCULAR | Status: AC
  Filled 2015-09-24: qty 1

## 2015-09-24 SURGICAL SUPPLY — 25 items
BALLN LUTONIX 5X120X130 (BALLOONS) ×5
BALLN LUTONIX DCB 7X60X130 (BALLOONS) ×5
BALLN ULTRVRSE  5X150X130C (BALLOONS) ×2
BALLN ULTRVRSE 5X150X130C (BALLOONS) ×3
BALLN ULTRVRSE 7X40X75C (BALLOONS) ×5
BALLOON LUTONIX 5X120X130 (BALLOONS) ×3 IMPLANT
BALLOON LUTONIX DCB 7X60X130 (BALLOONS) ×3 IMPLANT
BALLOON ULTRVRSE 5X150X130C (BALLOONS) ×3 IMPLANT
BALLOON ULTRVRSE 7X40X75C (BALLOONS) ×3 IMPLANT
CATH 5FR REUT (CATHETERS) ×5 IMPLANT
CATH PIG 70CM (CATHETERS) ×5 IMPLANT
CATH RIM 65CM (CATHETERS) ×5 IMPLANT
CATH VERT 100CM (CATHETERS) ×5 IMPLANT
DEVICE PRESTO INFLATION (MISCELLANEOUS) ×5 IMPLANT
DEVICE STARCLOSE SE CLOSURE (Vascular Products) ×5 IMPLANT
GLIDEWIRE ADV .035X260CM (WIRE) ×5 IMPLANT
PACK ANGIOGRAPHY (CUSTOM PROCEDURE TRAY) ×5 IMPLANT
SHEATH ANL2 6FRX45 HC (SHEATH) ×5 IMPLANT
SHEATH BRITE TIP 5FRX11 (SHEATH) ×5 IMPLANT
SHEATH HIGHFLEX ANSEL 6FRX55 (SHEATH) ×5 IMPLANT
STENT LIFESTAR 8X40 (Permanent Stent) ×5 IMPLANT
STENT TIGRIS 6X80X120 (Permanent Stent) ×5 IMPLANT
SYR MEDRAD MARK V 150ML (SYRINGE) ×5 IMPLANT
TUBING CONTRAST HIGH PRESS 72 (TUBING) ×5 IMPLANT
WIRE J 3MM .035X145CM (WIRE) ×5 IMPLANT

## 2015-09-24 NOTE — Discharge Instructions (Signed)
Angiogram, Care After °Refer to this sheet in the next few weeks. These instructions provide you with information about caring for yourself after your procedure. Your health care provider may also give you more specific instructions. Your treatment has been planned according to current medical practices, but problems sometimes occur. Call your health care provider if you have any problems or questions after your procedure. °WHAT TO EXPECT AFTER THE PROCEDURE °After your procedure, it is typical to have the following: °· Bruising at the catheter insertion site that usually fades within 1-2 weeks. °· Blood collecting in the tissue (hematoma) that may be painful to the touch. It should usually decrease in size and tenderness within 1-2 weeks. °HOME CARE INSTRUCTIONS °· Take medicines only as directed by your health care provider. °· You may shower 24-48 hours after the procedure or as directed by your health care provider. Remove the bandage (dressing) and gently wash the site with plain soap and water. Pat the area dry with a clean towel. Do not rub the site, because this may cause bleeding. °· Do not take baths, swim, or use a hot tub until your health care provider approves. °· Check your insertion site every day for redness, swelling, or drainage. °· Do not apply powder or lotion to the site. °· Do not lift over 10 lb (4.5 kg) for 5 days after your procedure or as directed by your health care provider. °· Ask your health care provider when it is okay to: °¨ Return to work or school. °¨ Resume usual physical activities or sports. °¨ Resume sexual activity. °· Do not drive home if you are discharged the same day as the procedure. Have someone else drive you. °· You may drive 24 hours after the procedure unless otherwise instructed by your health care provider. °· Do not operate machinery or power tools for 24 hours after the procedure or as directed by your health care provider. °· If your procedure was done as an  outpatient procedure, which means that you went home the same day as your procedure, a responsible adult should be with you for the first 24 hours after you arrive home. °· Keep all follow-up visits as directed by your health care provider. This is important. °SEEK MEDICAL CARE IF: °· You have a fever. °· You have chills. °· You have increased bleeding from the catheter insertion site. Hold pressure on the site. °SEEK IMMEDIATE MEDICAL CARE IF: °· You have unusual pain at the catheter insertion site. °· You have redness, warmth, or swelling at the catheter insertion site. °· You have drainage (other than a small amount of blood on the dressing) from the catheter insertion site. °· The catheter insertion site is bleeding, and the bleeding does not stop after 30 minutes of holding steady pressure on the site. °· The area near or just beyond the catheter insertion site becomes pale, cool, tingly, or numb. °  °This information is not intended to replace advice given to you by your health care provider. Make sure you discuss any questions you have with your health care provider. °  °Document Released: 12/26/2004 Document Revised: 06/30/2014 Document Reviewed: 11/10/2012 °Elsevier Interactive Patient Education ©2016 [employer] °Groin Insertion Instructions-If you lose feeling or develop tingling or pain in your leg or foot after the procedure, please walk around first.  If the discomfort does not improve , contact your physician and proceed to the nearest emergency room.  Loss of feeling in your leg might mean that a   blockage has formed in the artery and this can be appropriately treated.  Limit your activity for the next two days after your procedure.  Avoid stooping, bending, heavy lifting or exertion as this may put pressure on the insertion site.  Resume normal activities in 48 hours.  You may shower after 24 hours but avoid excessive warm water and do not scrub the site.  Remove clear dressing in 48 hours.  If you  have had a closure device inserted, do not soak in a tub bath or a hot tub for at least one week. ° °No driving for 48 hours after discharge.  After the procedure, check the insertion site occasionally.  If any oozing occurs or there is apparent swelling, firm pressure over the site will prevent a bruise from forming.  You can not hurt anything by pressing directly on the site.  The pressure stops the bleeding by allowing a small clot to form.  If the bleeding continues after the pressure has been applied for more than 15 minutes, call 911 or go to the nearest emergency room.   ° °The x-ray dye causes you to pass a considerate amount of urine.  For this reason, you will be asked to drink plenty of liquids after the procedure to prevent dehydration.  You may resume you regular diet.  Avoid caffeine products.   ° °For pain at the site of your procedure, take non-aspirin medicines such as Tylenol. ° °Medications: A. Hold Metformin for 48 hours if applicable.  B. Continue taking all your present medications at home unless your doctor prescribes any changes. °

## 2015-09-24 NOTE — Op Note (Signed)
[facility] Percutaneous Study/Intervention Procedural Note   Date of Surgery: 09/24/2015  Surgeon(s):Raine Elsass   Assistants:none  Pre-operative Diagnosis: PAD with claudication BLE  Post-operative diagnosis: Same  Procedure(s) Performed: 1. Ultrasound guidance for vascular access right femoral artery 2. Catheter placement into left popliteal artery from right femoral approach 3. Aortogram and selective left lower extremity angiogram 4. Percutaneous transluminal angioplasty of left distal SFA and proximal popliteal artery with 5 mm x 12 cm Lutonix drug coated angioplasty balloon 5. Percutaneous transluminal angioplasty of the right EIA with 7 mm x 6 cm length Lutonix drug coated angioplasty balloon  6.  Tigris stent placement to the left distal SFA and proximal popliteal artery with 6 mm x 8 cm length stent 7. Lifestar stent placement to the EIA with 8 mm x 4 cm stent  8.  StarClose closure device right femoral artery  EBL: 25 cc  Contrast: 110 cc  Fluoro Time: 10.9 minutes  Moderate Conscious Sedation Time: approximately 60 minutes using 4 mg of Versed and 150 mcg of Fentanyl  Indications: Patient is a 65 y.o.male with short distance claudication of both lower extremities. The patient has noninvasive study showing bilateral SFA disease and diseased iliac arteries. The patient is brought in for angiography for further evaluation and potential treatment. Risks and benefits are discussed and informed consent is obtained  Procedure: The patient was identified and appropriate procedural time out was performed. The patient was then placed supine on the table and prepped and draped in the usual sterile fashion.Moderate conscious sedation was administered during a face to face encounter with the patient throughout the procedure with my supervision of the RN administering  medicines and monitoring the patient's vital signs, pulse oximetry, telemetry and mental status throughout from the start of the procedure until the patient was taken to the recovery room. Ultrasound was used to evaluate the right common femoral artery. It was patent . A digital ultrasound image was acquired. A Seldinger needle was used to access the right common femoral artery under direct ultrasound guidance and a permanent image was performed. A 0.035 J wire was advanced without resistance and a 5Fr sheath was placed. Pigtail catheter was placed into the aorta and an AP aortogram was performed. This demonstrated multiple left renal arteries without stenosis on the left, singe patent right renal artery.  Aorta calcified but not stenotic, right proximal external iliac artery stenosis of 70-80%, left iliac artery highly calcific but not stenotic. I then crossed the aortic bifurcation and advanced to the left femoral head. Selective left lower extremity angiogram was then performed. This demonstrated a calcific left common femoral artery that did not appear highly stenotic, normal profunda femoris artery, and short segment occlusion of the distal left SFA and proximal popliteal artery. There was two vessel runoff distally.  I initially performed angioplasty of the right external iliac artery before placing the long sheath with a 7 mm x 6 cm Lutonix drug coated angioplasty balloon inflated to 10 atm for one minute.  This resulted in a sub optimal result with minimal improvement and >50% residual stenosis. The patient was systemically heparinized and a 6 [place] Ansel sheath was then placed over the [employer] wire. I then used a Kumpe catheter and the advantage wire to navigate through the distal SFA occlusion and confirm intraluminal flow in the popliteal artery below the occlusion.  The wire was then replaced.  Angioplasty with a 5 mm diameter x 12 cm length Lutonix drug coated angioplasty  balloon was done  in the distal SFA and proximal popliteal artery. This was inflated to 10 atm for one minute There was dissection and >50% residual stenosis there after the angioplasty, and I treated this with a 6 mm diameter x 8 cm length Tigris stent with no residual stenosis. I then pulled the long sheath back and treated the right external iliac lesion with an 8 mm x 4 cm Lifestar stent and post dilated this with a 7 mm balloon with no residual stenosis. I elected to terminate the procedure. The sheath was removed and StarClose closure device was deployed in the right femoral artery with excellent hemostatic result. The patient was taken to the recovery room in stable condition having tolerated the procedure well.  Findings:  Aortogram: multiple left renal arteries without stenosis on the left, singe patent right renal artery.  Aorta calcified but not stenotic, right proximal external iliac artery stenosis of 70-80%, left iliac artery highly calcific but not stenotic. Left Lower Extremity: a calcific left common femoral artery that did not appear highly stenotic, normal profunda femoris artery, and short segment occlusion of the distal left SFA and proximal popliteal artery. There was two vessel runoff distally.   Disposition: Patient was taken to the recovery room in stable condition having tolerated the procedure well.  Complications: None  Jay Dougherty 09/24/2015 10:24 AM

## 2015-09-24 NOTE — H&P (Signed)
  [facility] History & Physical Update  The patient was interviewed and re-examined.  The patient's previous History and Physical has been reviewed and is unchanged.  There is no change in the plan of care. We plan to proceed with the scheduled procedure.  DEW,JASON, MD  09/24/2015, 8:51 AM

## 2015-09-25 ENCOUNTER — Encounter: Payer: Self-pay | Admitting: Vascular Surgery

## 2015-10-19 ENCOUNTER — Other Ambulatory Visit: Payer: Self-pay | Admitting: Vascular Surgery

## 2015-10-19 DIAGNOSIS — I6523 Occlusion and stenosis of bilateral carotid arteries: Secondary | ICD-10-CM

## 2015-10-22 ENCOUNTER — Other Ambulatory Visit: Payer: Self-pay | Admitting: Vascular Surgery

## 2015-10-29 ENCOUNTER — Ambulatory Visit
Admission: RE | Admit: 2015-10-29 | Discharge: 2015-10-29 | Disposition: A | Discharge status: Home / Self Care | Payer: 59 | Source: Ambulatory Visit | Attending: Vascular Surgery | Admitting: Vascular Surgery

## 2015-10-29 ENCOUNTER — Ambulatory Visit: Payer: 59

## 2015-10-29 ENCOUNTER — Encounter
Admission: RE | Disposition: A | Discharge status: Home / Self Care | Payer: Self-pay | Source: Ambulatory Visit | Attending: Vascular Surgery

## 2015-10-29 DIAGNOSIS — I6523 Occlusion and stenosis of bilateral carotid arteries: Secondary | ICD-10-CM | POA: Diagnosis not present

## 2015-10-29 DIAGNOSIS — K559 Vascular disorder of intestine, unspecified: Secondary | ICD-10-CM | POA: Diagnosis not present

## 2015-10-29 DIAGNOSIS — B1920 Unspecified viral hepatitis C without hepatic coma: Secondary | ICD-10-CM | POA: Insufficient documentation

## 2015-10-29 DIAGNOSIS — I2510 Atherosclerotic heart disease of native coronary artery without angina pectoris: Secondary | ICD-10-CM | POA: Diagnosis not present

## 2015-10-29 DIAGNOSIS — E785 Hyperlipidemia, unspecified: Secondary | ICD-10-CM | POA: Insufficient documentation

## 2015-10-29 DIAGNOSIS — Z79899 Other long term (current) drug therapy: Secondary | ICD-10-CM | POA: Diagnosis not present

## 2015-10-29 DIAGNOSIS — I70213 Atherosclerosis of native arteries of extremities with intermittent claudication, bilateral legs: Secondary | ICD-10-CM | POA: Diagnosis not present

## 2015-10-29 DIAGNOSIS — Z7982 Long term (current) use of aspirin: Secondary | ICD-10-CM | POA: Diagnosis not present

## 2015-10-29 DIAGNOSIS — Z87891 Personal history of nicotine dependence: Secondary | ICD-10-CM | POA: Diagnosis not present

## 2015-10-29 DIAGNOSIS — I10 Essential (primary) hypertension: Secondary | ICD-10-CM | POA: Insufficient documentation

## 2015-10-29 HISTORY — PX: PERIPHERAL VASCULAR CATHETERIZATION: SHX172C

## 2015-10-29 SURGERY — LOWER EXTREMITY ANGIOGRAPHY
Anesthesia: Moderate Sedation | Site: Leg Lower | Laterality: Right

## 2015-10-29 MED ORDER — FENTANYL CITRATE (PF) 100 MCG/2ML IJ SOLN
INTRAMUSCULAR | Status: AC
  Filled 2015-10-29: qty 2

## 2015-10-29 MED ORDER — HEPARIN (PORCINE) IN NACL 2-0.9 UNIT/ML-% IJ SOLN
INTRAMUSCULAR | Status: AC
  Filled 2015-10-29: qty 1000

## 2015-10-29 MED ORDER — DEXTROSE 5 % IV SOLN
1.5000 g | INTRAVENOUS | Status: AC
  Administered 2015-10-29: 1.5 g via INTRAVENOUS

## 2015-10-29 MED ORDER — FAMOTIDINE 20 MG PO TABS
40.0000 mg | ORAL_TABLET | ORAL | Status: DC | PRN
Start: 2015-10-29 — End: 2015-10-29

## 2015-10-29 MED ORDER — LIDOCAINE-EPINEPHRINE (PF) 1 %-1:200000 IJ SOLN
INTRAMUSCULAR | Status: DC | PRN
  Administered 2015-10-29: 10 mL via INTRADERMAL

## 2015-10-29 MED ORDER — METHYLPREDNISOLONE SODIUM SUCC 125 MG IJ SOLR: 125.0000 mg | INTRAMUSCULAR | Status: DC | PRN

## 2015-10-29 MED ORDER — IOPAMIDOL (ISOVUE-300) INJECTION 61%
INTRAVENOUS | Status: DC | PRN
  Administered 2015-10-29: 45 mL via INTRA_ARTERIAL

## 2015-10-29 MED ORDER — HEPARIN SODIUM (PORCINE) 1000 UNIT/ML IJ SOLN
INTRAMUSCULAR | Status: AC
  Filled 2015-10-29: qty 1

## 2015-10-29 MED ORDER — MIDAZOLAM HCL 5 MG/5ML IJ SOLN
INTRAMUSCULAR | Status: AC
  Filled 2015-10-29: qty 5

## 2015-10-29 MED ORDER — MIDAZOLAM HCL 2 MG/2ML IJ SOLN
INTRAMUSCULAR | Status: DC | PRN
  Administered 2015-10-29: 1 mg via INTRAVENOUS
  Administered 2015-10-29: 2 mg via INTRAVENOUS
  Administered 2015-10-29: 1 mg via INTRAVENOUS

## 2015-10-29 MED ORDER — HYDROMORPHONE HCL 1 MG/ML IJ SOLN: 1.0000 mg | Freq: Once | INTRAMUSCULAR | Status: DC

## 2015-10-29 MED ORDER — LIDOCAINE-EPINEPHRINE (PF) 1 %-1:200000 IJ SOLN
INTRAMUSCULAR | Status: AC
  Filled 2015-10-29: qty 30

## 2015-10-29 MED ORDER — ACETAMINOPHEN 500 MG PO TABS
ORAL_TABLET | ORAL | Status: AC
  Administered 2015-10-29: 1000 mg
  Filled 2015-10-29: qty 2

## 2015-10-29 MED ORDER — FENTANYL CITRATE (PF) 100 MCG/2ML IJ SOLN
INTRAMUSCULAR | Status: DC | PRN
  Administered 2015-10-29 (×3): 50 ug via INTRAVENOUS

## 2015-10-29 MED ORDER — HEPARIN SODIUM (PORCINE) 1000 UNIT/ML IJ SOLN
INTRAMUSCULAR | Status: DC | PRN
  Administered 2015-10-29: 4000 [IU] via INTRAVENOUS

## 2015-10-29 MED ORDER — ONDANSETRON HCL 4 MG/2ML IJ SOLN: 4.0000 mg | Freq: Four times a day (QID) | INTRAMUSCULAR | Status: DC | PRN

## 2015-10-29 MED ORDER — SODIUM CHLORIDE 0.9 % IV SOLN
INTRAVENOUS | Status: DC
  Administered 2015-10-29: 09:00:00 via INTRAVENOUS

## 2015-10-29 SURGICAL SUPPLY — 16 items
BALLN LUTONIX 5X150X130 (BALLOONS) ×4
BALLN LUTONIX DCB 5X40X130 (BALLOONS) ×4
BALLOON LUTONIX 5X150X130 (BALLOONS) ×2 IMPLANT
BALLOON LUTONIX DCB 5X40X130 (BALLOONS) ×2 IMPLANT
CANNULA 5F STIFF (CANNULA) ×4 IMPLANT
CATH PIG 70CM (CATHETERS) ×4 IMPLANT
CATH RIM 65CM (CATHETERS) ×4 IMPLANT
DEVICE PRESTO INFLATION (MISCELLANEOUS) ×4 IMPLANT
DEVICE STARCLOSE SE CLOSURE (Vascular Products) ×4 IMPLANT
GLIDEWIRE ADV .035X260CM (WIRE) ×4 IMPLANT
PACK ANGIOGRAPHY (CUSTOM PROCEDURE TRAY) ×4 IMPLANT
SHEATH ANL2 6FRX45 HC (SHEATH) ×4 IMPLANT
SHEATH BRITE TIP 5FRX11 (SHEATH) ×4 IMPLANT
SYR MEDRAD MARK V 150ML (SYRINGE) ×4 IMPLANT
TUBING CONTRAST HIGH PRESS 72 (TUBING) ×4 IMPLANT
WIRE J 3MM .035X145CM (WIRE) ×4 IMPLANT

## 2015-10-29 NOTE — Discharge Instructions (Signed)
Angiogram, Care After °Refer to this sheet in the next few weeks. These instructions provide you with information about caring for yourself after your procedure. Your health care provider may also give you more specific instructions. Your treatment has been planned according to current medical practices, but problems sometimes occur. Call your health care provider if you have any problems or questions after your procedure. °WHAT TO EXPECT AFTER THE PROCEDURE °After your procedure, it is typical to have the following: °· Bruising at the catheter insertion site that usually fades within 1-2 weeks. °· Blood collecting in the tissue (hematoma) that may be painful to the touch. It should usually decrease in size and tenderness within 1-2 weeks. °HOME CARE INSTRUCTIONS °· Take medicines only as directed by your health care provider. °· You may shower 24-48 hours after the procedure or as directed by your health care provider. Remove the bandage (dressing) and gently wash the site with plain soap and water. Pat the area dry with a clean towel. Do not rub the site, because this may cause bleeding. °· Do not take baths, swim, or use a hot tub until your health care provider approves. °· Check your insertion site every day for redness, swelling, or drainage. °· Do not apply powder or lotion to the site. °· Do not lift over 10 lb (4.5 kg) for 5 days after your procedure or as directed by your health care provider. °· Ask your health care provider when it is okay to: °¨ Return to work or school. °¨ Resume usual physical activities or sports. °¨ Resume sexual activity. °· Do not drive home if you are discharged the same day as the procedure. Have someone else drive you. °· You may drive 24 hours after the procedure unless otherwise instructed by your health care provider. °· Do not operate machinery or power tools for 24 hours after the procedure or as directed by your health care provider. °· If your procedure was done as an  outpatient procedure, which means that you went home the same day as your procedure, a responsible adult should be with you for the first 24 hours after you arrive home. °· Keep all follow-up visits as directed by your health care provider. This is important. °SEEK MEDICAL CARE IF: °· You have a fever. °· You have chills. °· You have increased bleeding from the catheter insertion site. Hold pressure on the site. °SEEK IMMEDIATE MEDICAL CARE IF: °· You have unusual pain at the catheter insertion site. °· You have redness, warmth, or swelling at the catheter insertion site. °· You have drainage (other than a small amount of blood on the dressing) from the catheter insertion site. °· The catheter insertion site is bleeding, and the bleeding does not stop after 30 minutes of holding steady pressure on the site. °· The area near or just beyond the catheter insertion site becomes pale, cool, tingly, or numb. °  °This information is not intended to replace advice given to you by your health care provider. Make sure you discuss any questions you have with your health care provider. °  °Document Released: 12/26/2004 Document Revised: 06/30/2014 Document Reviewed: 11/10/2012 °Elsevier Interactive Patient Education ©2016 Else[employer] Inc. ° °

## 2015-10-29 NOTE — H&P (Signed)
  [facility] History & Physical Update  The patient was interviewed and re-examined.  The patient's previous History and Physical has been reviewed and is unchanged.  There is no change in the plan of care. We plan to proceed with the scheduled procedure.  Roemello Speyer, MD  10/29/2015, 8:49 AM

## 2015-10-29 NOTE — Op Note (Signed)
[facility] Percutaneous Study/Intervention Procedural Note   Date of Surgery: 10/29/2015  Surgeon(s):Genesi Stefanko   Assistants:none  Pre-operative Diagnosis: PAD with claudication bilateral lower extremities, status post left lower extremity intervention  Post-operative diagnosis: Same  Procedure(s) Performed: 1. Ultrasound guidance for vascular access left femoral artery 2. Catheter placement into right popliteal artery from left femoral approach 3. Aortogram and selective right lower extremity angiogram 4. Percutaneous transluminal angioplasty of popliteal artery at and just above the knee with a 5 mm diameter by 4 cm length Lutonix drug-coated angioplasty balloon 5. Percutaneous transluminal angioplasty of the right mid SFA with 5 mm diameter by 15 cm length Lutonix drug-coated angioplasty balloon  6. StarClose closure device left femoral artery  EBL: 20 cc  Contrast: 45 cc  Fluoro Time: 3.8 minutes  Moderate Conscious Sedation Time: approximately 40 minutes using 4 mg of Versed and 150 mcg of Fentanyl  Indications: Patient is a 65 y.o.male with short distance claudication in both lower extremities. He has had improvement in his left lower extremity after intervention.. The patient has noninvasive study showing popliteal and SFA stenosis with a mildly reduced ABI and waveforms. The patient is brought in for angiography for further evaluation and potential treatment. Risks and benefits are discussed and informed consent is obtained  Procedure: The patient was identified and appropriate procedural time out was performed. The patient was then placed supine on the table and prepped and draped in the usual sterile fashion.Moderate conscious sedation was administered during a face to face encounter with the patient throughout the procedure with my supervision of the RN administering  medicines and monitoring the patient's vital signs, pulse oximetry, telemetry and mental status throughout from the start of the procedure until the patient was taken to the recovery room. Ultrasound was used to evaluate the left common femoral artery. It was patent . A digital ultrasound image was acquired. A Seldinger needle was used to access the left common femoral artery under direct ultrasound guidance and a permanent image was performed. A 0.035 J wire was advanced without resistance and a 5Fr sheath was placed. Pigtail catheter was placed into the aorta and an AP aortogram was performed. This demonstrated normal renal arteries and normal aorta and iliac segments without significant stenosis. Imaging was done to also ensure that the right external iliac artery stent was patent and it was. I then crossed the aortic bifurcation and advanced to the right femoral head and into the proximal SFA. Selective right lower extremity angiogram was then performed. This demonstrated diffuse greater than 60% stenosis in the mid SFA over about 10-12 cm. Focal 75% stenosis in the popliteal artery just above the knee. Two-vessel runoff distally. Mild stenosis in the common femoral artery and proximal SFA and profunda femoris artery approaching 50% but does not appear to be hemodynamically significant at this time. The patient was systemically heparinized and a 6 [place] An[place] sheath was then placed over the [employer] wire. I then used a Kumpe catheter and the advantage wire to navigate through the SFA and popliteal lesions. I then proceeded with treatment. The popliteal stenosis was treated with a 5 mm diameter by 4 cm length Lutonix drug-coated angioplasty balloon. This was inflated to 14 atm for 1 minute. Completion angiogram showed about a 15-20% residual stenosis. I then turned my attention to the separate and distinct mid SFA stenosis. A 5 mm diameter by 15 cm length Lutonix drug-coated angioplasty balloon was  selected and then inflated to 12 atm  for 1 minute. Completion angiogram showed about a 20-25% residual stenosis at its worst without significant dissection. The common femoral stenosis did not appear high-grade and I elected not to treat this at this time. I elected to terminate the procedure. The sheath was removed and StarClose closure device was deployed in the left femoral artery with excellent hemostatic result. The patient was taken to the recovery room in stable condition having tolerated the procedure well.  Findings:  Aortogram: renal arteries patent, aorta patent, previous right iliac stent patent, no significant stenosis identified Right Lower Extremity: Diffuse greater than 60% stenosis in the mid SFA over about 10-12 cm. Focal 75% stenosis in the popliteal artery just above the knee. Two-vessel runoff distally. Mild stenosis in the common femoral artery and proximal SFA and profunda femoris artery approaching 50% but does not appear to be hemodynamically significant at this time.   Disposition: Patient was taken to the recovery room in stable condition having tolerated the procedure well.  Complications: None  Jay Dougherty 10/29/2015 10:00 AM

## 2015-10-30 ENCOUNTER — Encounter: Payer: Self-pay | Admitting: Vascular Surgery

## 2016-04-22 ENCOUNTER — Emergency Department: Payer: 59

## 2016-04-22 ENCOUNTER — Inpatient Hospital Stay
Admission: EM | Admit: 2016-04-22 | Discharge: 2016-04-25 | DRG: 378 | Disposition: A | Discharge status: Home / Self Care | Payer: 59 | Attending: Internal Medicine | Admitting: Internal Medicine

## 2016-04-22 DIAGNOSIS — Z791 Long term (current) use of non-steroidal anti-inflammatories (NSAID): Secondary | ICD-10-CM

## 2016-04-22 DIAGNOSIS — D62 Acute posthemorrhagic anemia: Secondary | ICD-10-CM | POA: Diagnosis present

## 2016-04-22 DIAGNOSIS — B182 Chronic viral hepatitis C: Secondary | ICD-10-CM | POA: Diagnosis present

## 2016-04-22 DIAGNOSIS — Z6833 Body mass index (BMI) 33.0-33.9, adult: Secondary | ICD-10-CM

## 2016-04-22 DIAGNOSIS — E785 Hyperlipidemia, unspecified: Secondary | ICD-10-CM | POA: Diagnosis present

## 2016-04-22 DIAGNOSIS — I8500 Esophageal varices without bleeding: Secondary | ICD-10-CM | POA: Diagnosis present

## 2016-04-22 DIAGNOSIS — I2510 Atherosclerotic heart disease of native coronary artery without angina pectoris: Secondary | ICD-10-CM | POA: Diagnosis present

## 2016-04-22 DIAGNOSIS — M1990 Unspecified osteoarthritis, unspecified site: Secondary | ICD-10-CM | POA: Diagnosis present

## 2016-04-22 DIAGNOSIS — D509 Iron deficiency anemia, unspecified: Secondary | ICD-10-CM | POA: Diagnosis present

## 2016-04-22 DIAGNOSIS — I739 Peripheral vascular disease, unspecified: Secondary | ICD-10-CM | POA: Diagnosis present

## 2016-04-22 DIAGNOSIS — K2971 Gastritis, unspecified, with bleeding: Principal | ICD-10-CM | POA: Diagnosis present

## 2016-04-22 DIAGNOSIS — Z7902 Long term (current) use of antithrombotics/antiplatelets: Secondary | ICD-10-CM | POA: Diagnosis not present

## 2016-04-22 DIAGNOSIS — Z8711 Personal history of peptic ulcer disease: Secondary | ICD-10-CM

## 2016-04-22 DIAGNOSIS — K640 First degree hemorrhoids: Secondary | ICD-10-CM | POA: Diagnosis present

## 2016-04-22 DIAGNOSIS — Z95820 Peripheral vascular angioplasty status with implants and grafts: Secondary | ICD-10-CM

## 2016-04-22 DIAGNOSIS — Z7982 Long term (current) use of aspirin: Secondary | ICD-10-CM | POA: Diagnosis not present

## 2016-04-22 DIAGNOSIS — I160 Hypertensive urgency: Secondary | ICD-10-CM | POA: Diagnosis present

## 2016-04-22 DIAGNOSIS — R0609 Other forms of dyspnea: Secondary | ICD-10-CM | POA: Diagnosis present

## 2016-04-22 DIAGNOSIS — K5730 Diverticulosis of large intestine without perforation or abscess without bleeding: Secondary | ICD-10-CM | POA: Diagnosis present

## 2016-04-22 DIAGNOSIS — I10 Essential (primary) hypertension: Secondary | ICD-10-CM | POA: Diagnosis present

## 2016-04-22 DIAGNOSIS — Z87891 Personal history of nicotine dependence: Secondary | ICD-10-CM | POA: Diagnosis not present

## 2016-04-22 DIAGNOSIS — R079 Chest pain, unspecified: Secondary | ICD-10-CM

## 2016-04-22 DIAGNOSIS — D649 Anemia, unspecified: Secondary | ICD-10-CM

## 2016-04-22 DIAGNOSIS — Z955 Presence of coronary angioplasty implant and graft: Secondary | ICD-10-CM

## 2016-04-22 DIAGNOSIS — R195 Other fecal abnormalities: Secondary | ICD-10-CM

## 2016-04-22 HISTORY — DX: Chronic viral hepatitis C: B18.2

## 2016-04-22 LAB — PREPARE RBC (CROSSMATCH)

## 2016-04-22 LAB — CBC
HEMATOCRIT: 25.1 % — AB (ref 40.0–52.0)
HEMOGLOBIN: 7.6 g/dL — AB (ref 13.0–18.0)
MCH: 19.2 pg — AB (ref 26.0–34.0)
MCHC: 30.2 g/dL — AB (ref 32.0–36.0)
MCV: 63.6 fL — AB (ref 80.0–100.0)
Platelets: 152 10*3/uL (ref 150–440)
RBC: 3.94 MIL/uL — AB (ref 4.40–5.90)
RDW: 20.9 % — ABNORMAL HIGH (ref 11.5–14.5)
WBC: 5.1 10*3/uL (ref 3.8–10.6)

## 2016-04-22 LAB — BASIC METABOLIC PANEL
ANION GAP: 9 (ref 5–15)
BUN: 22 mg/dL — ABNORMAL HIGH (ref 6–20)
CALCIUM: 8.8 mg/dL — AB (ref 8.9–10.3)
CHLORIDE: 104 mmol/L (ref 101–111)
CO2: 21 mmol/L — AB (ref 22–32)
Creatinine, Ser: 1.17 mg/dL (ref 0.61–1.24)
GFR calc non Af Amer: >60 mL/min (ref >60)
GLUCOSE: 121 mg/dL — AB (ref 65–99)
Potassium: 3.8 mmol/L (ref 3.5–5.1)
Sodium: 134 mmol/L — ABNORMAL LOW (ref 135–145)

## 2016-04-22 LAB — OCCULT BLOOD X 1 CARD TO LAB, STOOL: FECAL OCCULT BLD: POSITIVE — AB

## 2016-04-22 LAB — TROPONIN I
Troponin I: <0.03 ng/mL (ref <0.03)
Troponin I: <0.03 ng/mL (ref <0.03)

## 2016-04-22 LAB — HEMOGLOBIN AND HEMATOCRIT, BLOOD
HEMATOCRIT: 22.6 % — AB (ref 40.0–52.0)
HEMOGLOBIN: 6.7 g/dL — AB (ref 13.0–18.0)

## 2016-04-22 LAB — ABO/RH: ABO/RH(D): O POS

## 2016-04-22 MED ORDER — SODIUM CHLORIDE 0.9% FLUSH
3.0000 mL | Freq: Two times a day (BID) | INTRAVENOUS | Status: DC
  Administered 2016-04-22 – 2016-04-25 (×5): 3 mL via INTRAVENOUS

## 2016-04-22 MED ORDER — FAMOTIDINE IN NACL 20-0.9 MG/50ML-% IV SOLN
20.0000 mg | Freq: Two times a day (BID) | INTRAVENOUS | Status: DC
  Administered 2016-04-23 – 2016-04-25 (×4): 20 mg via INTRAVENOUS
  Filled 2016-04-22 (×6): qty 50

## 2016-04-22 MED ORDER — FUROSEMIDE 10 MG/ML IJ SOLN
20.0000 mg | Freq: Once | INTRAMUSCULAR | Status: AC
  Administered 2016-04-22: 20 mg via INTRAVENOUS
  Filled 2016-04-22: qty 2

## 2016-04-22 MED ORDER — PNEUMOCOCCAL VAC POLYVALENT 25 MCG/0.5ML IJ INJ: 0.5000 mL | INJECTION | INTRAMUSCULAR | Status: DC

## 2016-04-22 MED ORDER — ACETAMINOPHEN 650 MG RE SUPP: 650.0000 mg | Freq: Four times a day (QID) | RECTAL | Status: DC | PRN

## 2016-04-22 MED ORDER — SODIUM CHLORIDE 0.9 % IV SOLN
10.0000 mL/h | Freq: Once | INTRAVENOUS | Status: AC
  Administered 2016-04-22: 10 mL/h via INTRAVENOUS

## 2016-04-22 MED ORDER — ATORVASTATIN CALCIUM 20 MG PO TABS
40.0000 mg | ORAL_TABLET | Freq: Every day | ORAL | Status: DC
  Administered 2016-04-22 – 2016-04-25 (×4): 40 mg via ORAL
  Filled 2016-04-22 (×4): qty 2

## 2016-04-22 MED ORDER — METOPROLOL SUCCINATE ER 25 MG PO TB24
25.0000 mg | ORAL_TABLET | Freq: Every evening | ORAL | Status: DC
  Administered 2016-04-22: 22:00:00 25 mg via ORAL
  Filled 2016-04-22: qty 1

## 2016-04-22 MED ORDER — FAMOTIDINE IN NACL 20-0.9 MG/50ML-% IV SOLN
20.0000 mg | Freq: Once | INTRAVENOUS | Status: AC
  Administered 2016-04-22: 20 mg via INTRAVENOUS
  Filled 2016-04-22: qty 50

## 2016-04-22 MED ORDER — ONDANSETRON HCL 4 MG PO TABS: 4.0000 mg | ORAL_TABLET | Freq: Four times a day (QID) | ORAL | Status: DC | PRN

## 2016-04-22 MED ORDER — ONDANSETRON HCL 4 MG/2ML IJ SOLN: 4.0000 mg | Freq: Four times a day (QID) | INTRAMUSCULAR | Status: DC | PRN

## 2016-04-22 MED ORDER — METOPROLOL TARTRATE 5 MG/5ML IV SOLN
5.0000 mg | INTRAVENOUS | Status: DC | PRN
  Filled 2016-04-22 (×2): qty 5

## 2016-04-22 MED ORDER — ACETAMINOPHEN 325 MG PO TABS: 650.0000 mg | ORAL_TABLET | Freq: Four times a day (QID) | ORAL | Status: DC | PRN

## 2016-04-22 MED ORDER — OXYCODONE HCL 5 MG PO TABS
5.0000 mg | ORAL_TABLET | ORAL | Status: DC | PRN
Start: 2016-04-22 — End: 2016-04-25

## 2016-04-22 MED ORDER — SODIUM CHLORIDE 0.9 % IV SOLN
INTRAVENOUS | Status: AC
Start: 2016-04-23 — End: 2016-04-23
  Administered 2016-04-23: 03:00:00 via INTRAVENOUS

## 2016-04-22 NOTE — ED Notes (Addendum)
Pt updated that he would be going to his room soon. Pt is currently eating food that his wife brought him.

## 2016-04-22 NOTE — ED Notes (Addendum)
Pt has been feeling "poorly." Pt stating that he saw his PCP and they had told him his hemoglobin was low. Pt stating that they had a chest xray that showed something "going on with his left chest." Pt stating chest is tight and that his left leg has pain. Pt has had stents placed in BLE and peripheral artery disease. Increased pain in LLE the last 2 weeks. Pt has worse pain and SOB with excertion. Denies n/v

## 2016-04-22 NOTE — ED Provider Notes (Signed)
[facility] Emergency Department Provider Note        Time seen: ----------------------------------------- 2:13 PM on 04/22/2016 -----------------------------------------    I have reviewed the triage vital signs and the nursing notes.   HISTORY  Chief Complaint Abnormal Lab; Shortness of Breath; and Chest Pain    HPI Jay Dougherty is a 65 y.o. male who was sent here by his primary care doctor for anemia. His hemoglobin was 7.3 after being checked recently.Patient has noted dark stools but no frank blood. He does continue to have GI upset but takes antacids. He denies fevers, chills, but does have shortness of breath and dyspnea on exertion. He denies nausea, vomiting or diarrhea. Patient does occasionally have lower leg pain and has been having chest pain as well. Rest seems to improve his symptoms.   Past Medical History:  Diagnosis Date  . Coronary artery disease   . Hepatitis   . Hypertension   . Peripheral vascular disease ([place])     There are no active problems to display for this patient.   Past Surgical History:  Procedure Laterality Date  . PERIPHERAL VASCULAR CATHETERIZATION N/A 09/24/2015   Procedure: Abdominal Aortogram w/Lower Extremity;  Surgeon: Algernon Huxley, MD;  Location: [place] [facility];  Service: Cardiovascular;  Laterality: N/A;  . PERIPHERAL VASCULAR CATHETERIZATION  09/24/2015   Procedure: Lower Extremity Intervention;  Surgeon: Algernon Huxley, MD;  Location: [place] [facility];  Service: Cardiovascular;;  . PERIPHERAL VASCULAR CATHETERIZATION Right 10/29/2015   Procedure: Lower Extremity Angiography;  Surgeon: Algernon Huxley, MD;  Location: [place] [facility];  Service: Cardiovascular;  Laterality: Right;  . PERIPHERAL VASCULAR CATHETERIZATION  10/29/2015   Procedure: Lower Extremity Intervention;  Surgeon: Algernon Huxley, MD;  Location: [place] [facility];  Service: Cardiovascular;;    Allergies Review of patient's allergies  indicates no known allergies.  Social History Social History  Substance Use Topics  . Smoking status: Former Research scientist (life sciences)  . Smokeless tobacco: Not on file  . Alcohol use No    Review of Systems Constitutional: Negative for fever. Cardiovascular: Positive for chest pain Respiratory: Positive for shortness of breath Gastrointestinal: Negative for abdominal pain, vomiting and diarrhea. Genitourinary: Negative for dysuria. Musculoskeletal: Negative for back pain. Skin: Negative for rash. Neurological: Negative for headaches, focal weakness or numbness.  10-point ROS otherwise negative.  ____________________________________________   PHYSICAL EXAM:  VITAL SIGNS: ED Triage Vitals [04/22/16 1222]  Enc Vitals Group     BP (!) 162/62     Pulse Rate 66     Resp 16     Temp 97.9 F (36.6 C)     Temp Source Oral     SpO2 98 %     Weight 210 lb (95.3 kg)     Height 5\' 7"  (1.702 m)     Head Circumference      Peak Flow      Pain Score 5     Pain Loc      Pain Edu?      Excl. in [place]?     Constitutional: Alert and oriented. Well appearing and in no distress. Eyes: Conjunctivae are normal. PERRL. Normal extraocular movements. ENT   Head: Normocephalic and atraumatic.   Nose: No congestion/rhinnorhea.   Mouth/Throat: Mucous membranes are moist.   Neck: No stridor. Cardiovascular: Normal rate, regular rhythm. No murmurs, rubs, or gallops. Respiratory: Normal respiratory effort without tachypnea nor retractions. Breath sounds are clear and equal bilaterally. No wheezes/rales/rhonchi. Gastrointestinal:  Soft and nontender. Normal bowel sounds Rectal: Nontender, heme positive stool Musculoskeletal: Nontender with normal range of motion in all extremities. No lower extremity tenderness nor edema. Neurologic:  Normal speech and language. No gross focal neurologic deficits are appreciated.  Skin:  Skin is warm, dry and intact. No rash noted. Psychiatric: Mood and affect are  normal. Speech and behavior are normal.  ____________________________________________  EKG: Interpreted by me. Sinus rhythm with sinus arrhythmia, rate is 66 bpm, normal PR interval, normal QRS, normal QT interval.  ____________________________________________  ED COURSE:  Pertinent labs & imaging results that were available during my care of the patient were reviewed by me and considered in my medical decision making (see chart for details). Clinical Course  Patient presents to ER for weakness and reported outpatient diagnosis of anemia. We will reassess with labs and imaging if needed. He does have heme positive stools. We will give IV Pepcid.  Procedures ____________________________________________   LABS (pertinent positives/negatives)  Labs Reviewed  BASIC METABOLIC PANEL - Abnormal; Notable for the following:       Result Value   Sodium 134 (*)    CO2 21 (*)    Glucose, Bld 121 (*)    BUN 22 (*)    Calcium 8.8 (*)    All other components within normal limits  CBC - Abnormal; Notable for the following:    RBC 3.94 (*)    Hemoglobin 7.6 (*)    HCT 25.1 (*)    MCV 63.6 (*)    MCH 19.2 (*)    MCHC 30.2 (*)    RDW 20.9 (*)    All other components within normal limits  TROPONIN I  PREPARE RBC (CROSSMATCH)  TYPE AND SCREEN    RADIOLOGY  Chest x-ray reveals scarring at the left base. No edema or consolidation.  ____________________________________________  FINAL ASSESSMENT AND PLAN  Symptom medical anemia, Heme positive stool, dyspnea on exertion, chest pain  Plan: Patient with labs and imaging as dictated above. Patient presented to the ER with symptomatic anemia. I have ordered 1 unit of blood transfusion for him. He is also being given IV Pepcid. I will discuss with the hospitalist for admission, he would benefit from GI consultation.   Earleen Newport, MD   Note: This dictation was prepared with Dragon dictation. Any transcriptional errors that result  from this process are unintentional    Earleen Newport, MD 04/22/16 [phone]

## 2016-04-22 NOTE — ED Notes (Signed)
Attempted to call report. Nurse is to call back for report.

## 2016-04-22 NOTE — ED Triage Notes (Signed)
Pt sent here from his MD for anemia.  His  H & H is 7.3 & 24.8 respectively.  Pt also reports abnormal xray finding.  Has had SOB.  Reports increased fatigue and swelling in his leg.  Reports recently having stents placed in his leg 6 months ago.

## 2016-04-22 NOTE — H&P (Signed)
[place] [facility] [place] NAME: Jay Dougherty    MR#:  [id number]  DATE OF BIRTH:  1950/10/13  DATE OF ADMISSION:  04/22/2016  PRIMARY CARE PHYSICIAN: Volanda Napoleon, MD   REQUESTING/REFERRING PHYSICIAN: Williams  CHIEF COMPLAINT:   Dizziness HISTORY OF PRESENT ILLNESS:  Uhuru Hacking  is a 65 y.o. male with a known history of Coronary artery disease, peripheral vascular disease, hypertension and multiple other medical problems is presenting to the ED for abnormal labs. Patent patient was not feeling well for the past few days and had been to his primary care doctor who did blood work and came to know that his hemoglobin is low at 7.0. Patient is feeling dizzy and feels tight in his chest with some dyspnea. His lower extremity swelling which is chronic is getting worse progressively.  PAST MEDICAL HISTORY:   Past Medical History:  Diagnosis Date  . Coronary artery disease   . Hepatitis   . Hypertension   . Peripheral vascular disease ([place])     PAST SURGICAL HISTOIRY:   Past Surgical History:  Procedure Laterality Date  . PERIPHERAL VASCULAR CATHETERIZATION N/A 09/24/2015   Procedure: Abdominal Aortogram w/Lower Extremity;  Surgeon: Algernon Huxley, MD;  Location: [place] [facility];  Service: Cardiovascular;  Laterality: N/A;  . PERIPHERAL VASCULAR CATHETERIZATION  09/24/2015   Procedure: Lower Extremity Intervention;  Surgeon: Algernon Huxley, MD;  Location: [place] [facility];  Service: Cardiovascular;;  . PERIPHERAL VASCULAR CATHETERIZATION Right 10/29/2015   Procedure: Lower Extremity Angiography;  Surgeon: Algernon Huxley, MD;  Location: [place] [facility];  Service: Cardiovascular;  Laterality: Right;  . PERIPHERAL VASCULAR CATHETERIZATION  10/29/2015   Procedure: Lower Extremity Intervention;  Surgeon: Algernon Huxley, MD;  Location: [place] [facility];  Service: Cardiovascular;;    SOCIAL HISTORY:   Social History   Substance Use Topics  . Smoking status: Former Research scientist (life sciences)  . Smokeless tobacco: Not on file  . Alcohol use No    FAMILY HISTORY:  No family history on file.  DRUG ALLERGIES:  No Known Allergies  REVIEW OF SYSTEMS:  CONSTITUTIONAL: No fever, Reporting fatigue or weakness.  EYES: No blurred or double vision.  EARS, NOSE, AND THROAT: No tinnitus or ear pain.  RESPIRATORY: No cough, shortness of breath, wheezing or hemoptysis.  CARDIOVASCULAR: No chest pain, orthopnea, edema. Feeling dizzy GASTROINTESTINAL: No nausea, vomiting, diarrhea or abdominal pain.  GENITOURINARY: No dysuria, hematuria.  ENDOCRINE: No polyuria, nocturia,  HEMATOLOGY: No anemia, easy bruising or bleeding SKIN: No rash or lesion. MUSCULOSKELETAL: No joint pain or arthritis.   NEUROLOGIC: No tingling, numbness, weakness.  PSYCHIATRY: No anxiety or depression.   MEDICATIONS AT HOME:   Prior to Admission medications   Medication Sig Start Date End Date Taking? Authorizing Provider  aspirin 81 MG tablet Take 81 mg by mouth daily.   Yes Historical Provider, MD  atorvastatin (LIPITOR) 40 MG tablet Take 40 mg by mouth daily at 6 PM.    Yes Historical Provider, MD  celecoxib (CELEBREX) 200 MG capsule Take 200 mg by mouth 2 (two) times daily.   Yes Historical Provider, MD  cilostazol (PLETAL) 100 MG tablet Take 100 mg by mouth 2 (two) times daily.   Yes Historical Provider, MD  clopidogrel (PLAVIX) 75 MG tablet Take 75 mg by mouth every evening.    Yes Historical Provider, MD  metoprolol succinate (TOPROL-XL) 25 MG 24 hr tablet Take 25 mg by mouth  every evening.   Yes Historical Provider, MD  pantoprazole (PROTONIX) 40 MG tablet Take 40 mg by mouth every evening.   Yes Historical Provider, MD      VITAL SIGNS:  Blood pressure (!) 169/77, pulse 63, temperature 97.9 F (36.6 C), temperature source Oral, resp. rate (!) 24, height 5\' 7"  (1.702 m), weight 95.3 kg (210 lb), SpO2 100 %.  PHYSICAL EXAMINATION:  GENERAL:   65 y.o.-year-old patient lying in the bed with no acute distress.  EYES: Pupils equal, round, reactive to light and accommodation. No scleral icterus. Extraocular muscles intact.  HEENT: Head atraumatic, normocephalic. Oropharynx and nasopharynx clear.  NECK:  Supple, no jugular venous distention. No thyroid enlargement, no tenderness.  LUNGS: Normal breath sounds bilaterally, no wheezing, rales,rhonchi or crepitation. No use of accessory muscles of respiration.  CARDIOVASCULAR: S1, S2 normal. No murmurs, rubs, or gallops.  ABDOMEN: Soft, nontender, nondistended. Bowel sounds present. No organomegaly or mass.  EXTREMITIES: 1+ pedal edema, no cyanosis, or clubbing.  NEUROLOGIC: Cranial nerves II through XII are intact. Muscle strength 5/5 in all extremities. Sensation intact. Gait not checked.  PSYCHIATRIC: The patient is alert and oriented x 3.  SKIN: No obvious rash, lesion, or ulcer.   LABORATORY PANEL:   CBC  Recent Labs Lab 04/22/16 1236  WBC 5.1  HGB 7.6*  HCT 25.1*  PLT 152   ------------------------------------------------------------------------------------------------------------------  Chemistries   Recent Labs Lab 04/22/16 1236  NA 134*  K 3.8  CL 104  CO2 21*  GLUCOSE 121*  BUN 22*  CREATININE 1.17  CALCIUM 8.8*   ------------------------------------------------------------------------------------------------------------------  Cardiac Enzymes  Recent Labs Lab 04/22/16 1236  TROPONINI <0.03   ------------------------------------------------------------------------------------------------------------------  RADIOLOGY:  Dg Chest 2 View  Result Date: 04/22/2016 CLINICAL DATA:  Two week history of chest pain and wheezing EXAM: CHEST  2 VIEW COMPARISON:  May 10, 2014 FINDINGS: There is scarring in the posterolateral left base. Lungs elsewhere clear. Heart size and pulmonary vascularity are normal. No adenopathy. No bone lesions. IMPRESSION: Scarring  left base.  No edema or consolidation. Electronically Signed   By: Lowella Grip III M.D.   On: 04/22/2016 13:17    EKG:   Orders placed or performed during the hospital encounter of 04/22/16  . ED EKG  . ED EKG  . EKG 12-Lead  . EKG 12-Lead    IMPRESSION AND PLAN:   Jay Dougherty  is a 65 y.o. male with a known history of Coronary artery disease, peripheral vascular disease, hypertension and multiple other medical problems is presenting to the ED for abnormal labs. Patent patient was not feeling well for the past few days and had been to his primary care doctor who did blood work and came to know that his hemoglobin is low at 7.0  #Symptomatic anemia Admit to MedSurg unit with telemetry Stool for occult blood is positive in the emergency department Type and crossmatch and transfuse 1 unit of blood Monitor hemoglobin and hematocrit closely GI consult is placed-patient was seen by Dr. Thurmond Butts at [facility] in the past Recent colonoscopy 5 years ago was normal Not on any iron pills / Pepto-Bismol over-the-counter Provide GI prophylaxis with Pepcid IV twice a day Holding his home medications aspirin, Plavix and Pletal  Nothing by mouth after midnight for possible procedures in a.m.   #Hypertensive urgency Resume metoprolol 25 mg extended release which is his home medication and titrate as needed Provide IV Lopressor as needed  #History of coronary artery disease-reporting chest pain could be  from the symptomatic anemia Telemetry Blood transfusion and cycle cardiac biomarkers Currently holding his aspirin, Plavix Continue home medication Lipitor Echocardiogram ordered for worsening of peripheral edema  #Peripheral arterial disease status post stent placement Holding aspirin and Plavix as patient has symptomatic anemia Holding Pletal  #Hyperlipidemia statin   GI  prophylaxis with Pepcid IV   All the records are reviewed and case discussed with ED provider. Management plans  discussed with the patient, wife  and they are in agreement.  CODE STATUS: Full code, wife is the healthcare power of attorney  TOTAL TIME TAKING CARE OF THIS PATIENT:  45 minutes.   Note: This dictation was prepared with Dragon dictation along with smaller phrase technology. Any transcriptional errors that result from this process are unintentional.  Nicholes Mango M.D on 04/22/2016 at 4:31 PM  Between 7am to 6pm - Pager - [phone]  After 6pm go to [email or web address] - password [email or web address] [facility]  Office  [phone]  CC: Primary care physician; Volanda Napoleon, MD

## 2016-04-23 ENCOUNTER — Encounter: Payer: Self-pay | Admitting: Student

## 2016-04-23 ENCOUNTER — Inpatient Hospital Stay
Admit: 2016-04-23 | Discharge: 2016-04-23 | Disposition: A | Discharge status: Home / Self Care | Payer: 59 | Attending: Internal Medicine | Admitting: Internal Medicine

## 2016-04-23 LAB — COMPREHENSIVE METABOLIC PANEL
ALT: 13 U/L — ABNORMAL LOW (ref 17–63)
ANION GAP: 7 (ref 5–15)
AST: 23 U/L (ref 15–41)
Albumin: 3.9 g/dL (ref 3.5–5.0)
Alkaline Phosphatase: 49 U/L (ref 38–126)
BILIRUBIN TOTAL: 1.3 mg/dL — AB (ref 0.3–1.2)
BUN: 23 mg/dL — AB (ref 6–20)
CO2: 23 mmol/L (ref 22–32)
Calcium: 8.7 mg/dL — ABNORMAL LOW (ref 8.9–10.3)
Chloride: 107 mmol/L (ref 101–111)
Creatinine, Ser: 1.34 mg/dL — ABNORMAL HIGH (ref 0.61–1.24)
GFR, EST NON AFRICAN AMERICAN: 54 mL/min — AB (ref >60)
Glucose, Bld: 115 mg/dL — ABNORMAL HIGH (ref 65–99)
POTASSIUM: 3.4 mmol/L — AB (ref 3.5–5.1)
Sodium: 137 mmol/L (ref 135–145)
TOTAL PROTEIN: 7.3 g/dL (ref 6.5–8.1)

## 2016-04-23 LAB — CBC
HEMATOCRIT: 24.8 % — AB (ref 40.0–52.0)
HEMOGLOBIN: 7.7 g/dL — AB (ref 13.0–18.0)
MCH: 20.1 pg — ABNORMAL LOW (ref 26.0–34.0)
MCHC: 31.2 g/dL — ABNORMAL LOW (ref 32.0–36.0)
MCV: 64.5 fL — ABNORMAL LOW (ref 80.0–100.0)
Platelets: 139 10*3/uL — ABNORMAL LOW (ref 150–440)
RBC: 3.85 MIL/uL — AB (ref 4.40–5.90)
RDW: 22.3 % — AB (ref 11.5–14.5)
WBC: 4.9 10*3/uL (ref 3.8–10.6)

## 2016-04-23 LAB — TROPONIN I

## 2016-04-23 LAB — ECHOCARDIOGRAM COMPLETE
HEIGHTINCHES: 67 in
Weight: 3380.8 oz

## 2016-04-23 LAB — PREPARE RBC (CROSSMATCH)

## 2016-04-23 LAB — HEMOGLOBIN: Hemoglobin: 7.7 g/dL — ABNORMAL LOW (ref 13.0–18.0)

## 2016-04-23 LAB — HEMOGLOBIN AND HEMATOCRIT, BLOOD
HEMATOCRIT: 26.5 % — AB (ref 40.0–52.0)
Hemoglobin: 7.9 g/dL — ABNORMAL LOW (ref 13.0–18.0)

## 2016-04-23 LAB — PROTIME-INR
INR: 1.19
Prothrombin Time: 15.2 seconds (ref 11.4–15.2)

## 2016-04-23 MED ORDER — FUROSEMIDE 10 MG/ML IJ SOLN
40.0000 mg | Freq: Once | INTRAMUSCULAR | Status: AC
  Administered 2016-04-23: 19:00:00 40 mg via INTRAVENOUS
  Filled 2016-04-23: qty 4

## 2016-04-23 MED ORDER — ALPRAZOLAM ER 0.5 MG PO TB24
0.5000 mg | ORAL_TABLET | Freq: Every evening | ORAL | Status: DC | PRN
  Administered 2016-04-24 (×2): 0.5 mg via ORAL
  Filled 2016-04-23 (×2): qty 1

## 2016-04-23 MED ORDER — IPRATROPIUM-ALBUTEROL 0.5-2.5 (3) MG/3ML IN SOLN
3.0000 mL | RESPIRATORY_TRACT | Status: DC | PRN
  Administered 2016-04-23 – 2016-04-24 (×2): 3 mL via RESPIRATORY_TRACT
  Filled 2016-04-23 (×2): qty 3

## 2016-04-23 MED ORDER — IPRATROPIUM-ALBUTEROL 0.5-2.5 (3) MG/3ML IN SOLN
3.0000 mL | Freq: Once | RESPIRATORY_TRACT | Status: AC
  Administered 2016-04-23: 3 mL via RESPIRATORY_TRACT
  Filled 2016-04-23: qty 3

## 2016-04-23 MED ORDER — HYDRALAZINE HCL 20 MG/ML IJ SOLN
10.0000 mg | INTRAMUSCULAR | Status: DC | PRN
  Administered 2016-04-23 – 2016-04-24 (×4): 10 mg via INTRAVENOUS
  Filled 2016-04-23 (×4): qty 1

## 2016-04-23 MED ORDER — POTASSIUM CHLORIDE CRYS ER 20 MEQ PO TBCR
40.0000 meq | EXTENDED_RELEASE_TABLET | Freq: Once | ORAL | Status: AC
Start: 2016-04-23 — End: 2016-04-23
  Administered 2016-04-23: 40 meq via ORAL
  Filled 2016-04-23: qty 2

## 2016-04-23 MED ORDER — SODIUM CHLORIDE 0.9 % IV SOLN
Freq: Once | INTRAVENOUS | Status: AC
  Administered 2016-04-23: 19:00:00 via INTRAVENOUS

## 2016-04-23 MED ORDER — METOPROLOL SUCCINATE ER 50 MG PO TB24
100.0000 mg | ORAL_TABLET | Freq: Every evening | ORAL | Status: DC
  Administered 2016-04-23 – 2016-04-24 (×2): 100 mg via ORAL
  Filled 2016-04-23 (×2): qty 2

## 2016-04-23 NOTE — Consult Note (Signed)
Patient with hx of cardiac stens in 2003, mild restenosis in 2011, on plavix and hx of chronic hepatitis C.  EKG was normal.  See note from [facility].  Plan to do EGD tomorrow due to heme positive stool and anemia with hgb fell to 6.7.  He is getting a second unit of blood today.

## 2016-04-23 NOTE — Progress Notes (Signed)
*  PRELIMINARY RESULTS* Echocardiogram 2D Echocardiogram has been performed.  Jay Dougherty 04/23/2016, 8:18 AM

## 2016-04-23 NOTE — Consult Note (Signed)
GI Inpatient Consult Note  Reason for Consult: Symptomatic anemia    Attending Requesting Consult:  Dr. Margaretmary Eddy  History of Present Illness: Jay Dougherty is a 65 y.o. male with a known history of CAD (s/p stent placement in 2003, last cath in 2011 with mld restenosis, on Plavix), PVD (s/p catheterization x 2), HTN, and chronic hepatitis C (s/p tx w/ Harvoni) admitted with symptomatic anemia.  Patient presented to his PCP after reporting dizziness, chest tightness, dyspnea on exertion, and general malaise for several days.  Labs demonstrated Hgb 7.3, so ED evaluation was recommended.  Upon arrival, systolic BP was elevated at 162, otherwise vital signs stable.  Rectal exam revealed heme positive stool.  CBC demonstrated Hgb 7.6, HCT 25.1, MCV 63.6.  CMP was notable for BUN 22, sodium 134.  CXR demonstrated scarring in the posterior lateral left base, otherwise unremarkable.  ECG was WNL.  Patient was admitted for further evaluation and management, including initiation of IV Pepcid 20 mg every 12 hours and request for GI consultation.  Upon admission, Hgb decreased to 6.7.  Patient received 1 unit PRBCs, and has remained stable at 7.9 > 7.7 today.  Today, patient reports fatigue and dyspnea is a bit improved since receiving one unit.  He continues to feel a bit short of breath when walking, and notes continued lower extremity swelling.  He denies lower extremity swell at baseline.  Patient endorses occasional indigestion, presently well managed on Protonix 40 mg daily.  He has also experienced mild epigastric discomfort and tightness over the last several weeks. He also believes that his stools have been black for "a few weeks", but he did not realize they were black until the ED provider stated stools were black on hemoccult tests.  No dysphagia, nausea, or vomiting.  He takes Celebrex daily for arthritis.  Patient is a former smoker, smoking 1 ppd until about 1 year ago.  He drinks EtOH  "occasionally". No history of H pylori infection. He underwent a colonoscopy in 2012, which was normal.  No prior EGDs.  No FHx of CRC, colon polyps, or other GI malignancy.  Notably, patient's last dose of Plavix was Monday night.  Past Medical History:  Past Medical History:  Diagnosis Date  . Coronary artery disease   . Hepatitis   . Hypertension   . Peripheral vascular disease ([place])     Problem List: Patient Active Problem List   Diagnosis Date Noted  . Symptomatic anemia 04/22/2016    Past Surgical History: Past Surgical History:  Procedure Laterality Date  . PERIPHERAL VASCULAR CATHETERIZATION N/A 09/24/2015   Procedure: Abdominal Aortogram w/Lower Extremity;  Surgeon: Algernon Huxley, MD;  Location: [place] [facility];  Service: Cardiovascular;  Laterality: N/A;  . PERIPHERAL VASCULAR CATHETERIZATION  09/24/2015   Procedure: Lower Extremity Intervention;  Surgeon: Algernon Huxley, MD;  Location: [place] [facility];  Service: Cardiovascular;;  . PERIPHERAL VASCULAR CATHETERIZATION Right 10/29/2015   Procedure: Lower Extremity Angiography;  Surgeon: Algernon Huxley, MD;  Location: [place] [facility];  Service: Cardiovascular;  Laterality: Right;  . PERIPHERAL VASCULAR CATHETERIZATION  10/29/2015   Procedure: Lower Extremity Intervention;  Surgeon: Algernon Huxley, MD;  Location: [place] [facility];  Service: Cardiovascular;;    Allergies: No Known Allergies  Home Medications: Prescriptions Prior to Admission  Medication Sig Dispense Refill Last Dose  . aspirin 81 MG tablet Take 81 mg by mouth daily.   04/21/2016 at Unknown time  . atorvastatin (LIPITOR) 40  MG tablet Take 40 mg by mouth daily at 6 PM.    04/21/2016 at Unknown time  . celecoxib (CELEBREX) 200 MG capsule Take 200 mg by mouth 2 (two) times daily.   04/21/2016 at Unknown time  . cilostazol (PLETAL) 100 MG tablet Take 100 mg by mouth 2 (two) times daily.   04/22/2016 at 1000  . clopidogrel (PLAVIX) 75 MG tablet Take 75 mg by mouth  every evening.    04/21/2016 at Unknown time  . metoprolol succinate (TOPROL-XL) 25 MG 24 hr tablet Take 25 mg by mouth every evening.   04/21/2016 at Unknown time  . pantoprazole (PROTONIX) 40 MG tablet Take 40 mg by mouth every evening.   04/21/2016 at Unknown time   Home medication reconciliation was completed with the patient.   Scheduled Inpatient Medications:   . atorvastatin  40 mg Oral q1800  . famotidine (PEPCID) IV  20 mg Intravenous Q12H  . metoprolol succinate  100 mg Oral QPM  . pneumococcal 23 valent vaccine  0.5 mL Intramuscular Tomorrow-1000  . potassium chloride  40 mEq Oral Once  . sodium chloride flush  3 mL Intravenous Q12H    Continuous Inpatient Infusions:     PRN Inpatient Medications:  acetaminophen **OR** acetaminophen, hydrALAZINE, metoprolol, ondansetron **OR** ondansetron (ZOFRAN) IV, oxyCODONE  Family History: family history is not on file.    Social History:   reports that he has quit smoking. He does not have any smokeless tobacco history on file. He reports that he does not drink alcohol or use drugs.    Review of Systems: Constitutional: Weight is stable.  Eyes: No changes in vision. ENT: No oral lesions, sore throat.  GI: see HPI.  Heme/Lymph: No easy bruising.  CV: No chest pain.  GU: No hematuria.  Integumentary: No rashes.  Neuro: No headaches.  Psych: No depression/anxiety.  Endocrine: No heat/cold intolerance.  Allergic/Immunologic: No urticaria.  Resp: No cough, SOB.  Musculoskeletal: No joint swelling.    Physical Examination: BP (!) 208/73 (BP Location: Right Arm)   Pulse 60   Temp 97.6 F (36.4 C) (Oral)   Resp 18   Ht 5\' 7"  (1.702 m)   Wt 95.8 kg (211 lb 4.8 oz)   SpO2 100%   BMI 33.09 kg/m  Gen: NAD, alert and oriented x 4 HEENT: PEERLA, EOMI, Neck: supple, no JVD or thyromegaly Chest: CTA bilaterally, no wheezes, crackles, or other adventitious sounds CV: RRR, no m/g/c/r Abd: soft, NT, ND, +BS in all four  quadrants; no HSM, guarding, ridigity, or rebound tenderness Ext: no edema, well perfused with 2+ pulses, Skin: no rash or lesions noted Lymph: no LAD  Data: Lab Results  Component Value Date   WBC 4.9 04/23/2016   HGB 7.7 (L) 04/23/2016   HCT 24.8 (L) 04/23/2016   MCV 64.5 (L) 04/23/2016   PLT 139 (L) 04/23/2016    Recent Labs Lab 04/22/16 1935 04/23/16 0336 04/23/16 0720  HGB 6.7* 7.9* 7.7*   Lab Results  Component Value Date   NA 137 04/23/2016   K 3.4 (L) 04/23/2016   CL 107 04/23/2016   CO2 23 04/23/2016   BUN 23 (H) 04/23/2016   CREATININE 1.34 (H) 04/23/2016   Lab Results  Component Value Date   ALT 13 (L) 04/23/2016   AST 23 04/23/2016   ALKPHOS 49 04/23/2016   BILITOT 1.3 (H) 04/23/2016    Recent Labs Lab 04/23/16 0720  INR 1.19   Assessment/Plan: Mr. Lebsack is a 65  y.o. male  with a known history of CAD (s/p stent placement in 2003, last cath in 2011 with mld restenosis, on Plavix), PVD (s/p catheterization x 2), HTN, and chronic hepatitis C (s/p tx w/ Harvoni) admitted with symptomatic anemia.  Hgb was 7.6 upon arrival, decreasing to 6.7 about 7 hours later.  Hgb improved appropriately following 1 unit PRBCs, and Hgb remains stable around 7.  BUN remains stable around 23.  Hemoccult cards +.  With a normal colonoscopy in 2012 and no CRC alarm symptoms noted, etiology is likely an upper GI bleed from ulcers or gastritis secondary to chronic NSAID use.   Recommendations: - Transfuse 1 unit PRBCs to relieve cardiac strain due to anemia - Plan for EGD tomorrow - further info per Dr. Vira Agar - Clear liquids tonight and tomorrow morning, then NPO until procedure - Xanax 0.5mg  qhs prn ordered for insomnia since admission - Monitor Hgb, transfuse if <7  Thank you for the consult. We will follow along with you. Please call with questions or concerns.  Lavera Guise, PA-C [facility] Phone: [phone] Pager: [phone]

## 2016-04-23 NOTE — Plan of Care (Signed)
Problem: Education: Goal: Knowledge of [facility] Education information/materials will improve Outcome: Progressing Pt likes to be called Jay Dougherty  Past Medical History:  Diagnosis Date  . Coronary artery disease   . Hepatitis   . Hypertension   . Peripheral vascular disease ([place])    Pt is well controlled with home medications

## 2016-04-23 NOTE — Progress Notes (Signed)
[place] at [place] NAME: Jay Dougherty    MR#:  [id number]  DATE OF BIRTH:  Sep 07, 1950  SUBJECTIVE:  CHIEF COMPLAINT:   Chief Complaint  Patient presents with  . Abnormal Lab  . Shortness of Breath  . Chest Pain   No melena today. No abdominal pain. Some improvement in symptoms after transfusion  REVIEW OF SYSTEMS:    Review of Systems  Constitutional: Positive for malaise/fatigue. Negative for chills and fever.  HENT: Negative for sore throat.   Eyes: Negative for blurred vision, double vision and pain.  Respiratory: Negative for cough, hemoptysis, shortness of breath and wheezing.   Cardiovascular: Negative for chest pain, palpitations, orthopnea and leg swelling.  Gastrointestinal: Negative for abdominal pain, constipation, diarrhea, heartburn, nausea and vomiting.  Genitourinary: Negative for dysuria and hematuria.  Musculoskeletal: Positive for joint pain. Negative for back pain.  Skin: Negative for rash.  Neurological: Positive for weakness. Negative for sensory change, speech change, focal weakness and headaches.  Endo/Heme/Allergies: Does not bruise/bleed easily.  Psychiatric/Behavioral: Negative for depression. The patient is not nervous/anxious.     DRUG ALLERGIES:  No Known Allergies  VITALS:  Blood pressure (!) 181/70, pulse 68, temperature 97.5 F (36.4 C), temperature source Oral, resp. rate 20, height 5\' 7"  (1.702 m), weight 95.8 kg (211 lb 4.8 oz), SpO2 100 %.  PHYSICAL EXAMINATION:   Physical Exam  GENERAL:  65 y.o.-year-old patient lying in the bed with no acute distress.  EYES: Pupils equal, round, reactive to light and accommodation. No scleral icterus. Extraocular muscles intact.  HEENT: Head atraumatic, normocephalic. Oropharynx and nasopharynx clear.  NECK:  Supple, no jugular venous distention. No thyroid enlargement, no tenderness.  LUNGS: Normal breath sounds bilaterally, no wheezing, rales,  rhonchi. No use of accessory muscles of respiration.  CARDIOVASCULAR: S1, S2 normal. No murmurs, rubs, or gallops.  ABDOMEN: Soft, nontender, nondistended. Bowel sounds present. No organomegaly or mass.  EXTREMITIES: No cyanosis, clubbing or edema b/l.    NEUROLOGIC: Cranial nerves II through XII are intact. No focal Motor or sensory deficits b/l.   PSYCHIATRIC: The patient is alert and oriented x 3.  SKIN: No obvious rash, lesion, or ulcer.   LABORATORY PANEL:   CBC  Recent Labs Lab 04/23/16 0720  WBC 4.9  HGB 7.7*  HCT 24.8*  PLT 139*   ------------------------------------------------------------------------------------------------------------------ Chemistries   Recent Labs Lab 04/23/16 0720  NA 137  K 3.4*  CL 107  CO2 23  GLUCOSE 115*  BUN 23*  CREATININE 1.34*  CALCIUM 8.7*  AST 23  ALT 13*  ALKPHOS 49  BILITOT 1.3*   ------------------------------------------------------------------------------------------------------------------  Cardiac Enzymes  Recent Labs Lab 04/23/16 0720  TROPONINI <0.03   ------------------------------------------------------------------------------------------------------------------  RADIOLOGY:  Dg Chest 2 View  Result Date: 04/22/2016 CLINICAL DATA:  Two week history of chest pain and wheezing EXAM: CHEST  2 VIEW COMPARISON:  May 10, 2014 FINDINGS: There is scarring in the posterolateral left base. Lungs elsewhere clear. Heart size and pulmonary vascularity are normal. No adenopathy. No bone lesions. IMPRESSION: Scarring left base.  No edema or consolidation. Electronically Signed   By: Lowella Grip III M.D.   On: 04/22/2016 13:17     ASSESSMENT AND PLAN:   Sunday Cappel  is a 65 y.o. male with a known history of Coronary artery disease, peripheral vascular disease, hypertension and multiple other medical problems is presenting to the ED for abnormal labs. Patent patient was not feeling well for the  past few  days and had been to his primary care doctor who did blood work and came to know that his hemoglobin is low at 7.0  # Acute blood loss anemia due to upper GI bleed Stool for occult blood is positive in the emergency department Status post 1 unit packed RBC transfusion Recent colonoscopy 5 years ago was normal Uses Celebrex for arthritis. GI consulted. Nothing by mouth. IV Pepcid. Will need EGD. Monitor hemoglobin. Iron supplementation at discharge.  #Hypertension Increase metoprolol to 100mg  daily  #History of coronary artery disease-reporting chest pain could be from the symptomatic anemia Telemetry Troponin normal Currently holding his aspirin, Plavix Continue home medication Lipitor Echocardiogram - No wall motion abnormalities  #Peripheral arterial disease status post stent placement Holding aspirin and Plavix and pletal  #Hyperlipidemia statin  All the records are reviewed and case discussed with Care Management/Social Workerr. Management plans discussed with the patient, family and they are in agreement.  CODE STATUS: FULL CODE  DVT Prophylaxis: SCDs  TOTAL TIME TAKING CARE OF THIS PATIENT: 40 minutes.   POSSIBLE D/C IN 1-2 DAYS, DEPENDING ON CLINICAL CONDITION.  Hillary Bow R M.D on 04/23/2016 at 12:00 PM  Between 7am to 6pm - Pager - [phone]  After 6pm go to [email or web address] - password [email or web address] [facility]  Office  [phone]  CC: Primary care physician; Volanda Napoleon, MD  Note: This dictation was prepared with Dragon dictation along with smaller phrase technology. Any transcriptional errors that result from this process are unintentional.

## 2016-04-24 ENCOUNTER — Encounter
Admission: EM | Disposition: A | Discharge status: Home / Self Care | Payer: Self-pay | Source: Home / Self Care | Attending: Internal Medicine

## 2016-04-24 ENCOUNTER — Inpatient Hospital Stay: Payer: 59 | Admitting: Certified Registered Nurse Anesthetist

## 2016-04-24 HISTORY — PX: ESOPHAGOGASTRODUODENOSCOPY (EGD) WITH PROPOFOL: SHX5813

## 2016-04-24 LAB — HEMOGLOBIN: HEMOGLOBIN: 8.7 g/dL — AB (ref 13.0–18.0)

## 2016-04-24 LAB — TYPE AND SCREEN
ABO/RH(D): O POS
Antibody Screen: NEGATIVE
UNIT DIVISION: 0
Unit division: 0

## 2016-04-24 LAB — HEMOGLOBIN AND HEMATOCRIT, BLOOD
HCT: 28.5 % — ABNORMAL LOW (ref 40.0–52.0)
HEMOGLOBIN: 8.8 g/dL — AB (ref 13.0–18.0)

## 2016-04-24 LAB — GLUCOSE, CAPILLARY: Glucose-Capillary: 95 mg/dL (ref 65–99)

## 2016-04-24 SURGERY — EGD (ESOPHAGOGASTRODUODENOSCOPY)
Anesthesia: General

## 2016-04-24 SURGERY — ESOPHAGOGASTRODUODENOSCOPY (EGD) WITH PROPOFOL
Anesthesia: General

## 2016-04-24 MED ORDER — AMLODIPINE BESYLATE 5 MG PO TABS
5.0000 mg | ORAL_TABLET | Freq: Every day | ORAL | Status: DC
  Administered 2016-04-24 – 2016-04-25 (×2): 5 mg via ORAL
  Filled 2016-04-24 (×2): qty 1

## 2016-04-24 MED ORDER — PEG 3350-KCL-NA BICARB-NACL 420 G PO SOLR
4000.0000 mL | Freq: Once | ORAL | Status: AC
  Administered 2016-04-25: 4000 mL via ORAL
  Filled 2016-04-24: qty 4000

## 2016-04-24 MED ORDER — SODIUM CHLORIDE 0.9 % IV SOLN
INTRAVENOUS | Status: DC | PRN
  Administered 2016-04-24: 16:00:00 via INTRAVENOUS

## 2016-04-24 MED ORDER — FENTANYL CITRATE (PF) 100 MCG/2ML IJ SOLN
INTRAMUSCULAR | Status: DC | PRN
  Administered 2016-04-24: 50 ug via INTRAVENOUS

## 2016-04-24 MED ORDER — PROPOFOL 500 MG/50ML IV EMUL
INTRAVENOUS | Status: DC | PRN
  Administered 2016-04-24: 180 ug/kg/min via INTRAVENOUS

## 2016-04-24 MED ORDER — MIDAZOLAM HCL 2 MG/2ML IJ SOLN
INTRAMUSCULAR | Status: DC | PRN
  Administered 2016-04-24: 1 mg via INTRAVENOUS

## 2016-04-24 MED ORDER — PROPOFOL 10 MG/ML IV BOLUS
INTRAVENOUS | Status: DC | PRN
  Administered 2016-04-24: 80 mg via INTRAVENOUS

## 2016-04-24 MED ORDER — SODIUM CHLORIDE 0.9 % IV SOLN
Freq: Once | INTRAVENOUS | Status: AC
  Administered 2016-04-24: 15:00:00 via INTRAVENOUS

## 2016-04-24 NOTE — Anesthesia Preprocedure Evaluation (Signed)
Anesthesia Evaluation  Patient identified by MRN, date of birth, ID band Patient awake    Reviewed: Allergy & Precautions, NPO status , Patient's Chart, lab work & pertinent test results, reviewed documented beta blocker date and time   History of Anesthesia Complications Negative for: history of anesthetic complications  Airway Mallampati: II  TM Distance: >3 FB Neck ROM: Full    Dental no notable dental hx.    Pulmonary neg sleep apnea, neg COPD, former smoker,    breath sounds clear to auscultation- rhonchi (-) wheezing      Cardiovascular hypertension, Pt. on medications and Pt. on home beta blockers + CAD and + Cardiac Stents   Rhythm:Regular Rate:Normal - Systolic murmurs and - Diastolic murmurs    Neuro/Psych negative neurological ROS  negative psych ROS   GI/Hepatic (+) Hepatitis -, CAcute GIB   Endo/Other  negative endocrine ROSneg diabetes  Renal/GU negative Renal ROS     Musculoskeletal   Abdominal (+) + obese,   Peds  Hematology  (+) anemia ,   Anesthesia Other Findings Past Medical History: No date: Chronic hepatitis C ([place])     Comment: s/p treatment with Harvoni x 12 weeks               completed 10/2014, SVR maintained at 48 weeks. No date: Coronary artery disease No date: Hypertension No date: Peripheral vascular disease ([place])   Reproductive/Obstetrics                             Anesthesia Physical Anesthesia Plan  ASA: III  Anesthesia Plan: General   Post-op Pain Management:    Induction: Intravenous  Airway Management Planned: Natural Airway  Additional Equipment:   Intra-op Plan:   Post-operative Plan:   Informed Consent: I have reviewed the patients History and Physical, chart, labs and discussed the procedure including the risks, benefits and alternatives for the proposed anesthesia with the patient or authorized representative who has indicated  his/her understanding and acceptance.   Dental advisory given  Plan Discussed with: CRNA and Anesthesiologist  Anesthesia Plan Comments:         Anesthesia Quick Evaluation

## 2016-04-24 NOTE — Transfer of Care (Signed)
Immediate Anesthesia Transfer of Care Note  Patient: Jay Dougherty  Procedure(s) Performed: Procedure(s): ESOPHAGOGASTRODUODENOSCOPY (EGD) WITH PROPOFOL (N/A)  Patient Location: PACU  Anesthesia Type:General  Level of Consciousness: awake  Airway & Oxygen Therapy: Patient Spontanous Breathing and Patient connected to nasal cannula oxygen  Post-op Assessment: Report given to RN and Post -op Vital signs reviewed and stable  Post vital signs: Reviewed and stable  Last Vitals:  Vitals:   04/24/16 1431 04/24/16 1640  BP: (!) 168/71 (!) 123/37  Pulse: 64 (!) 57  Resp: 18 15  Temp: 36.2 C 36.4 C    Last Pain:  Vitals:   04/24/16 1640  TempSrc: Tympanic  PainSc: Asleep         Complications: No apparent anesthesia complications

## 2016-04-24 NOTE — Consult Note (Signed)
Patient wants to go ahead and have a colonoscopy tomorrow,will prep tomorrow morning and do procedure late tomorrow.

## 2016-04-24 NOTE — Consult Note (Signed)
Patient had some scattered gastritis in fundus area, no clots and no active bleeding or sign of any recent bleeding. Will start full liquid diet.  He has a few small varices in lower esophagus, flatten with any insufflation.  Hx of Hep C treated.  Consider colonoscopy later on because of anemia.

## 2016-04-24 NOTE — Progress Notes (Signed)
[place] at [place] NAME: Jay Dougherty    MR#:  [id number]  DATE OF BIRTH:  15-Apr-1951  SUBJECTIVE:  CHIEF COMPLAINT:   Chief Complaint  Patient presents with  . Abnormal Lab  . Shortness of Breath  . Chest Pain   - admitted with hip pain, dyspnea, pedal edema- hb on adm around 7, improved after 2 units of Tx - now at 8.7 - guaiac positive stool -For EGD today  REVIEW OF SYSTEMS:  Review of Systems  Constitutional: Negative for chills, fever and malaise/fatigue.  HENT: Negative for ear discharge, ear pain and nosebleeds.   Eyes: Negative for blurred vision and double vision.  Respiratory: Negative for cough, shortness of breath and wheezing.   Cardiovascular: Positive for leg swelling. Negative for chest pain and palpitations.  Gastrointestinal: Negative for abdominal pain, constipation, diarrhea, nausea and vomiting.  Genitourinary: Negative for dysuria.  Musculoskeletal: Positive for joint pain and myalgias.  Neurological: Negative for dizziness, tingling, speech change, focal weakness, seizures and headaches.    DRUG ALLERGIES:  No Known Allergies  VITALS:  Blood pressure (!) 168/71, pulse 64, temperature 97.2 F (36.2 C), temperature source Oral, resp. rate 18, height 5\' 7"  (1.702 m), weight 95.7 kg (211 lb), SpO2 100 %.  PHYSICAL EXAMINATION:  Physical Exam  GENERAL:  65 y.o.-year-old patient lying in the bed with no acute distress.  EYES: Pupils equal, round, reactive to light and accommodation. No scleral icterus. Extraocular muscles intact.  HEENT: Head atraumatic, normocephalic. Oropharynx and nasopharynx clear.  NECK:  Supple, no jugular venous distention. No thyroid enlargement, no tenderness.  LUNGS: Normal breath sounds bilaterally, no wheezing, rales,rhonchi or crepitation. No use of accessory muscles of respiration.  CARDIOVASCULAR: S1, S2 normal. No murmurs, rubs, or gallops.  ABDOMEN: Soft, nontender,  nondistended. Bowel sounds present. No organomegaly or mass.  EXTREMITIES: No pedal edema, cyanosis, or clubbing.  NEUROLOGIC: Cranial nerves II through XII are intact. Muscle strength 5/5 in all extremities. Sensation intact. Gait not checked.  PSYCHIATRIC: The patient is alert and oriented x 3.  SKIN: No obvious rash, lesion, or ulcer.    LABORATORY PANEL:   CBC  Recent Labs Lab 04/23/16 0720  04/24/16 0010 04/24/16 0609  WBC 4.9  --   --   --   HGB 7.7*  < > 8.8* 8.7*  HCT 24.8*  --  28.5*  --   PLT 139*  --   --   --   < > = values in this interval not displayed. ------------------------------------------------------------------------------------------------------------------  Chemistries   Recent Labs Lab 04/23/16 0720  NA 137  K 3.4*  CL 107  CO2 23  GLUCOSE 115*  BUN 23*  CREATININE 1.34*  CALCIUM 8.7*  AST 23  ALT 13*  ALKPHOS 49  BILITOT 1.3*   ------------------------------------------------------------------------------------------------------------------  Cardiac Enzymes  Recent Labs Lab 04/23/16 0720  TROPONINI <0.03   ------------------------------------------------------------------------------------------------------------------  RADIOLOGY:  No results found.  EKG:   Orders placed or performed during the hospital encounter of 04/22/16  . ED EKG  . ED EKG  . EKG 12-Lead  . EKG 12-Lead    ASSESSMENT AND PLAN:   65 year old male with past medical history significant for CAD, peripheral vascular disease, hypertension, chronic hepatitis presented to hospital secondary to symptomatic anemia.  #1 symptomatic anemia-hemoglobin at 7 on admission. Guaiac was positive. Last colonoscopy about 4 years ago was normal. History of prior EGDs due to breath Barrett's esophagus and gastric ulcers  more than 10 years ago. -Appreciate GI consult. For EGD today. -Advance diet after that -Received 2 units packed RBC transfusion since admission.  Hemoglobin at 8.7. -Also used Celebrex for arthritis -Continue IV Pepcid  #2 CAD and peripheral vascular disease status post stents -Has hip pain radiating down to the leg secondary to his PVD. Follows up with vascular as an outpatient. Currently his aspirin and Plavix are on hold. -Continue Lipitor  #3 hypertension-blood pressure is elevated. Increase the dose of Toprol yesterday. Add Norvasc. Also on IV hydralazine when necessary  #4 hyperlipidemia-on statin  #5 DVT prophylaxis-start subcutaneous heparin after EGD. Ambulatory    All the records are reviewed and case discussed with Care Management/Social Workerr. Management plans discussed with the patient, family and they are in agreement.  CODE STATUS: Full Code  TOTAL TIME TAKING CARE OF THIS PATIENT: 37 minutes.   POSSIBLE D/C IN 1-2 DAYS, DEPENDING ON CLINICAL CONDITION.   Gladstone Lighter M.D on 04/24/2016 at 3:25 PM  Between 7am to 6pm - Pager - [phone]  After 6pm go to [email or web address] - password [facility]  Office  [phone]  CC: Primary care physician; Volanda Napoleon, MD

## 2016-04-24 NOTE — Anesthesia Procedure Notes (Signed)
Date/Time: 04/24/2016 4:15 PM Performed by: Allean Found Pre-anesthesia Checklist: Patient identified, Emergency Drugs available, Suction available, Patient being monitored and Timeout performed Patient Re-evaluated:Patient Re-evaluated prior to inductionOxygen Delivery Method: Nasal cannula Preoxygenation: Pre-oxygenation with 100% oxygen Intubation Type: IV induction

## 2016-04-24 NOTE — Progress Notes (Addendum)
Dr. Vira Agar paged regarding his note stating patient would have EGD on 05/13/16 but no current orders. VSS. Hgb 8.7  Update 0754am- EGD planned for this afternoon. Dr. Vira Agar stated clear liquids for breakfast then NPO sips with meds. Dr. Vira Agar stated they will get consent when he goes for procedure. Will update Jay Dougherty of plan for the day.

## 2016-04-24 NOTE — Op Note (Signed)
[facility] Gastroenterology Patient Name: Jay Dougherty Procedure Date: 04/24/2016 4:17 PM MRN: [id number] Account #: [id number] Date of Birth: 1950/12/26 Admit Type: Inpatient Age: 65 Room: [facility] ENDO ROOM 1 Gender: Male Note Status: Finalized Procedure:            Upper GI endoscopy Indications:          Heme positive stool, Anemia Providers:            Manya Silvas, MD Referring MD:         Venetia Maxon. Elijio Miles, MD (Referring MD) Medicines:            Propofol per Anesthesia Complications:        No immediate complications. Procedure:            Pre-Anesthesia Assessment:                       - After reviewing the risks and benefits, the patient                        was deemed in satisfactory condition to undergo the                        procedure.                       After obtaining informed consent, the endoscope was                        passed under direct vision. Throughout the procedure,                        the patient's blood pressure, pulse, and oxygen                        saturations were monitored continuously. The Endoscope                        was introduced through the mouth, and advanced to the                        second part of duodenum. The upper GI endoscopy was                        accomplished without difficulty. The patient tolerated                        the procedure well. Findings:      Non-bleeding minimal grade I varices were found in the lower third of       the esophagus,. No stigmata of recent bleeding were evident and no red       wale signs were present.      The duodenal bulb and second portion of the duodenum were normal. A       small superficial polyp seen in distal bulb was biopsied.      Patchy mild inflammation characterized by erosions, erythema and       granularity was found in the gastric fundus. Impression:           - Non-bleeding grade I esophageal varices.                       -  Normal  stomach.                       - Normal duodenal bulb and second portion of the                        duodenum.                       - No specimens collected. Recommendation:       - The findings and recommendations were discussed with                        the patient's family. Start full liquid diet, carafate                        and PPI recommended. Manya Silvas, MD 04/24/2016 4:39:04 PM This report has been signed electronically. Number of Addenda: 0 Note Initiated On: 04/24/2016 4:17 PM      [facility]

## 2016-04-25 ENCOUNTER — Inpatient Hospital Stay: Payer: 59 | Admitting: Anesthesiology

## 2016-04-25 ENCOUNTER — Encounter: Payer: Self-pay | Admitting: Unknown Physician Specialty

## 2016-04-25 ENCOUNTER — Encounter
Admission: EM | Disposition: A | Discharge status: Home / Self Care | Payer: Self-pay | Source: Home / Self Care | Attending: Internal Medicine

## 2016-04-25 HISTORY — PX: COLONOSCOPY WITH PROPOFOL: SHX5780

## 2016-04-25 LAB — CBC
HEMATOCRIT: 30 % — AB (ref 40.0–52.0)
HEMOGLOBIN: 9.6 g/dL — AB (ref 13.0–18.0)
MCH: 21.3 pg — ABNORMAL LOW (ref 26.0–34.0)
MCHC: 31.8 g/dL — ABNORMAL LOW (ref 32.0–36.0)
MCV: 66.8 fL — AB (ref 80.0–100.0)
Platelets: 171 10*3/uL (ref 150–440)
RBC: 4.5 MIL/uL (ref 4.40–5.90)
RDW: 24.2 % — ABNORMAL HIGH (ref 11.5–14.5)
WBC: 6.7 10*3/uL (ref 3.8–10.6)

## 2016-04-25 LAB — BASIC METABOLIC PANEL
ANION GAP: 9 (ref 5–15)
BUN: 20 mg/dL (ref 6–20)
CO2: 25 mmol/L (ref 22–32)
Calcium: 9.4 mg/dL (ref 8.9–10.3)
Chloride: 102 mmol/L (ref 101–111)
Creatinine, Ser: 1.37 mg/dL — ABNORMAL HIGH (ref 0.61–1.24)
GFR calc Af Amer: >60 mL/min (ref >60)
GFR calc non Af Amer: 53 mL/min — ABNORMAL LOW (ref >60)
GLUCOSE: 112 mg/dL — AB (ref 65–99)
POTASSIUM: 4.4 mmol/L (ref 3.5–5.1)
Sodium: 136 mmol/L (ref 135–145)

## 2016-04-25 LAB — IRON AND TIBC
IRON: 50 ug/dL (ref 45–182)
Saturation Ratios: 7 % — ABNORMAL LOW (ref 17.9–39.5)
TIBC: 689 ug/dL — AB (ref 250–450)
UIBC: 639 ug/dL

## 2016-04-25 LAB — FOLATE: FOLATE: 18.4 ng/mL (ref >5.9)

## 2016-04-25 LAB — RETICULOCYTES
RBC.: 4.5 MIL/uL (ref 4.40–5.90)
Retic Count, Absolute: 148.5 10*3/uL (ref 19.0–183.0)
Retic Ct Pct: 3.3 % — ABNORMAL HIGH (ref 0.4–3.1)

## 2016-04-25 LAB — FERRITIN: FERRITIN: 24 ng/mL (ref 24–336)

## 2016-04-25 LAB — VITAMIN B12: Vitamin B-12: 362 pg/mL (ref 180–914)

## 2016-04-25 SURGERY — COLONOSCOPY WITH PROPOFOL
Anesthesia: General

## 2016-04-25 MED ORDER — PROPOFOL 500 MG/50ML IV EMUL
INTRAVENOUS | Status: DC | PRN
  Administered 2016-04-25: 120 ug/kg/min via INTRAVENOUS

## 2016-04-25 MED ORDER — AMLODIPINE BESYLATE 5 MG PO TABS: 5.0000 mg | ORAL_TABLET | Freq: Every day | ORAL | 3 refills | Status: DC

## 2016-04-25 MED ORDER — PANTOPRAZOLE SODIUM 40 MG PO TBEC: 40.0000 mg | DELAYED_RELEASE_TABLET | Freq: Two times a day (BID) | ORAL | Status: DC

## 2016-04-25 MED ORDER — LIDOCAINE 2% (20 MG/ML) 5 ML SYRINGE
INTRAMUSCULAR | Status: DC | PRN
  Administered 2016-04-25: 50 mg via INTRAVENOUS

## 2016-04-25 MED ORDER — SUCRALFATE 1 G PO TABS: 1.0000 g | ORAL_TABLET | Freq: Three times a day (TID) | ORAL | 0 refills | Status: AC

## 2016-04-25 MED ORDER — PROPOFOL 10 MG/ML IV BOLUS
INTRAVENOUS | Status: DC | PRN
  Administered 2016-04-25: 30 mg via INTRAVENOUS
  Administered 2016-04-25: 70 mg via INTRAVENOUS

## 2016-04-25 MED ORDER — SODIUM CHLORIDE 0.9 % IV SOLN
INTRAVENOUS | Status: DC
  Administered 2016-04-25: 1000 mL via INTRAVENOUS

## 2016-04-25 MED ORDER — SUCRALFATE 1 G PO TABS
1.0000 g | ORAL_TABLET | Freq: Three times a day (TID) | ORAL | Status: DC
  Administered 2016-04-25 (×2): 1 g via ORAL
  Filled 2016-04-25 (×2): qty 1

## 2016-04-25 MED ORDER — METOPROLOL SUCCINATE ER 50 MG PO TB24
75.0000 mg | ORAL_TABLET | Freq: Every evening | ORAL | Status: DC
  Administered 2016-04-25: 75 mg via ORAL
  Filled 2016-04-25: qty 1

## 2016-04-25 MED ORDER — METOPROLOL SUCCINATE ER 50 MG PO TB24: 50.0000 mg | ORAL_TABLET | Freq: Every evening | ORAL | 2 refills | Status: AC

## 2016-04-25 NOTE — Progress Notes (Signed)
Dr. Tressia Miners notified patient has been sinus rhythm/sinus brady on telemetry this morning running between 40-50's. Metoprolol recently increased 100mg  on 04/23/16. MD to re-evaluate medication.

## 2016-04-25 NOTE — Anesthesia Preprocedure Evaluation (Signed)
Anesthesia Evaluation  Patient identified by MRN, date of birth, ID band Patient awake    Reviewed: Allergy & Precautions, NPO status , Patient's Chart, lab work & pertinent test results  Airway Mallampati: III       Dental  (+) Teeth Intact   Pulmonary neg pulmonary ROS, former smoker,     + decreased breath sounds      Cardiovascular Exercise Tolerance: Poor hypertension, Pt. on medications + angina with exertion + CAD and + Peripheral Vascular Disease   Rhythm:Regular Rate:Normal     Neuro/Psych TIA   GI/Hepatic negative GI ROS, (+) Hepatitis -, C  Endo/Other  Morbid obesity  Renal/GU negative Renal ROS     Musculoskeletal   Abdominal (+) + obese,   Peds  Hematology  (+) anemia ,   Anesthesia Other Findings   Reproductive/Obstetrics                             Anesthesia Physical Anesthesia Plan  ASA: II  Anesthesia Plan: General   Post-op Pain Management:    Induction: Intravenous  Airway Management Planned: Natural Airway and Nasal Cannula  Additional Equipment:   Intra-op Plan:   Post-operative Plan:   Informed Consent: I have reviewed the patients History and Physical, chart, labs and discussed the procedure including the risks, benefits and alternatives for the proposed anesthesia with the patient or authorized representative who has indicated his/her understanding and acceptance.     Plan Discussed with: CRNA  Anesthesia Plan Comments:         Anesthesia Quick Evaluation

## 2016-04-25 NOTE — Progress Notes (Signed)
[place] at [place] NAME: Jay Dougherty    MR#:  [id number]  DATE OF BIRTH:  Apr 21, 1951  SUBJECTIVE:  CHIEF COMPLAINT:   Chief Complaint  Patient presents with  . Abnormal Lab  . Shortness of Breath  . Chest Pain   - EGD with minimal gastritis, for colonoscopy today - weakness improving - Hb stable and >9 today  REVIEW OF SYSTEMS:  Review of Systems  Constitutional: Negative for chills, fever and malaise/fatigue.  HENT: Negative for ear discharge, ear pain and nosebleeds.   Eyes: Negative for blurred vision and double vision.  Respiratory: Negative for cough, shortness of breath and wheezing.   Cardiovascular: Negative for chest pain, palpitations and leg swelling.  Gastrointestinal: Positive for diarrhea. Negative for abdominal pain, constipation, nausea and vomiting.       Due to colonoscopy prep  Genitourinary: Negative for dysuria.  Musculoskeletal: Positive for joint pain and myalgias.  Neurological: Negative for dizziness, tingling, speech change, focal weakness, seizures and headaches.    DRUG ALLERGIES:  No Known Allergies  VITALS:  Blood pressure (!) 145/68, pulse (!) 57, temperature 97.6 F (36.4 C), temperature source Oral, resp. rate 18, height 5\' 7"  (1.702 m), weight 91.2 kg (201 lb 1.6 oz), SpO2 100 %.  PHYSICAL EXAMINATION:  Physical Exam  GENERAL:  65 y.o.-year-old patient lying in the bed with no acute distress.  EYES: Pupils equal, round, reactive to light and accommodation. No scleral icterus. Extraocular muscles intact.  HEENT: Head atraumatic, normocephalic. Oropharynx and nasopharynx clear.  NECK:  Supple, no jugular venous distention. No thyroid enlargement, no tenderness.  LUNGS: Normal breath sounds bilaterally, no wheezing, rales,rhonchi or crepitation. No use of accessory muscles of respiration.  CARDIOVASCULAR: S1, S2 normal. No murmurs, rubs, or gallops.  ABDOMEN: Soft, nontender,  nondistended. Bowel sounds present. No organomegaly or mass.  EXTREMITIES: No pedal edema, cyanosis, or clubbing.  NEUROLOGIC: Cranial nerves II through XII are intact. Muscle strength 5/5 in all extremities. Sensation intact. Gait not checked.  PSYCHIATRIC: The patient is alert and oriented x 3.  SKIN: No obvious rash, lesion, or ulcer.    LABORATORY PANEL:   CBC  Recent Labs Lab 04/25/16 0920  WBC 6.7  HGB 9.6*  HCT 30.0*  PLT 171   ------------------------------------------------------------------------------------------------------------------  Chemistries   Recent Labs Lab 04/23/16 0720  NA 137  K 3.4*  CL 107  CO2 23  GLUCOSE 115*  BUN 23*  CREATININE 1.34*  CALCIUM 8.7*  AST 23  ALT 13*  ALKPHOS 49  BILITOT 1.3*   ------------------------------------------------------------------------------------------------------------------  Cardiac Enzymes  Recent Labs Lab 04/23/16 0720  TROPONINI <0.03   ------------------------------------------------------------------------------------------------------------------  RADIOLOGY:  No results found.  EKG:   Orders placed or performed during the hospital encounter of 04/22/16  . ED EKG  . ED EKG  . EKG 12-Lead  . EKG 12-Lead    ASSESSMENT AND PLAN:   65 year old male with past medical history significant for CAD, peripheral vascular disease, hypertension, chronic hepatitis presented to hospital secondary to symptomatic anemia.  #1 symptomatic anemia-hemoglobin at 7 on admission. Guaiac was positive. Last colonoscopy about 4 years ago was normal.  -History of prior EGDs due to breath Barrett's esophagus and gastric ulcers more than 10 years ago. EGD yesterday with gastritis, no active bleeding. Added PPI and carafate -Appreciate GI consult. For colonoscopy today. -Advance diet after that -Received 2 units packed RBC transfusion since admission. Hemoglobin >9 this morning. -Also hold Celebrex for  arthritis  #2 CAD and peripheral vascular disease status post stents -Has hip pain radiating down to the leg secondary to his PVD. Follows up with vascular as an outpatient. Currently his aspirin and Plavix are on hold. -Continue Lipitor  #3 hypertension-blood pressure is elevated. Adjusted toprol dose (esp bradycardia with high doses). Added Norvasc. Improved BP  Also on IV hydralazine when necessary  #4 hyperlipidemia-on statin  #5 DVT prophylaxis- subcutaneous heparin after colonoscopy. But Ambulatory  Possible discharge tomorrow    All the records are reviewed and case discussed with Care Management/Social Workerr. Management plans discussed with the patient, family and they are in agreement.  CODE STATUS: Full Code  TOTAL TIME TAKING CARE OF THIS PATIENT: 37 minutes.   POSSIBLE D/C TOMORROW, DEPENDING ON CLINICAL CONDITION.   Gladstone Lighter M.D on 04/25/2016 at 11:05 AM  Between 7am to 6pm - Pager - [phone]  After 6pm go to [email or web address] - password [email or web address]  Office  [phone]  CC: Primary care physician; Volanda Napoleon, MD

## 2016-04-25 NOTE — Op Note (Signed)
[facility] Gastroenterology Patient Name: Jay Dougherty Procedure Date: 04/25/2016 7:44 AM MRN: [id number] Account #: [id number] Date of Birth: 1950-11-23 Admit Type: Outpatient Age: 65 Room: [facility] ENDO ROOM 4 Gender: Male Note Status: Finalized Procedure:            Colonoscopy Indications:          Iron deficiency anemia secondary to chronic blood loss Providers:            Manya Silvas, MD Referring MD:         Venetia Maxon. Elijio Miles, MD (Referring MD) Medicines:            Propofol per Anesthesia Complications:        No immediate complications. Procedure:            Pre-Anesthesia Assessment:                       - After reviewing the risks and benefits, the patient                        was deemed in satisfactory condition to undergo the                        procedure.                       After obtaining informed consent, the colonoscope was                        passed under direct vision. Throughout the procedure,                        the patient's blood pressure, pulse, and oxygen                        saturations were monitored continuously. The                        Colonoscope was introduced through the anus and                        advanced to the the cecum, identified by appendiceal                        orifice and ileocecal valve. The colonoscopy was                        performed without difficulty. The patient tolerated the                        procedure well. The quality of the bowel preparation                        was good. Findings:      Internal hemorrhoids were found during endoscopy. The hemorrhoids were       medium-sized and Grade I (internal hemorrhoids that do not prolapse).      The exam was otherwise without abnormality.      A few small-and medium mouthed diverticula were found in the sigmoid       colon. No blood seen anywhere. Impression:           -  Internal hemorrhoids.                       - The  examination was otherwise normal.                       - No specimens collected. Recommendation:       - The findings and recommendations were discussed with                        the patient's family. Manya Silvas, MD 04/25/2016 3:25:54 PM This report has been signed electronically. Number of Addenda: 0 Note Initiated On: 04/25/2016 7:44 AM Scope Withdrawal Time: 0 hours 7 minutes 30 seconds  Total Procedure Duration: 0 hours 13 minutes 21 seconds       [facility]

## 2016-04-25 NOTE — Progress Notes (Signed)
Report from Endo RN Norlene Campbell, patient stable to d/c this evening. Plan for outpatient capsule study due to no findings from colonoscopy or EGD. Dr. Tressia Miners paged to be notified.

## 2016-04-25 NOTE — Transfer of Care (Signed)
Immediate Anesthesia Transfer of Care Note  Patient: Jay Dougherty  Procedure(s) Performed: Procedure(s): COLONOSCOPY WITH PROPOFOL (N/A)  Patient Location: Endoscopy Unit  Anesthesia Type:General  Level of Consciousness: awake  Airway & Oxygen Therapy: Patient Spontanous Breathing and Patient connected to nasal cannula oxygen  Post-op Assessment: Report given to RN and Post -op Vital signs reviewed and stable  Post vital signs: Reviewed  Last Vitals:  Vitals:   04/25/16 1358 04/25/16 1527  BP: (!) 160/57 115/61  Pulse: (!) 57 (!) 55  Resp: 20 14  Temp: 37.1 C     Last Pain:  Vitals:   04/25/16 1358  TempSrc: Tympanic  PainSc:          Complications: No apparent anesthesia complications

## 2016-04-25 NOTE — Progress Notes (Addendum)
Endo was called to see expected time for patient's colonoscopy this afternoon. Patient was added to schedule for colonoscopy this afternoon but RN unable to give definite time at this time.   Patient aware plan is for colonoscopy late this afternoon. Bowel prep completed. BM appropriate- loose, clear. Patient resting comfortably. VSS. Hgb 9.6 today.

## 2016-04-25 NOTE — Anesthesia Postprocedure Evaluation (Signed)
Anesthesia Post Note  Patient: Jay Dougherty  Procedure(s) Performed: Procedure(s) (LRB): COLONOSCOPY WITH PROPOFOL (N/A)  Patient location during evaluation: Endoscopy Anesthesia Type: General Level of consciousness: awake and alert Pain management: pain level controlled Vital Signs Assessment: post-procedure vital signs reviewed and stable Respiratory status: spontaneous breathing and respiratory function stable Cardiovascular status: stable Anesthetic complications: no    Last Vitals:  Vitals:   04/25/16 1527 04/25/16 1528  BP: 115/61 115/61  Pulse: (!) 55 (!) 52  Resp: 14   Temp:  (!) 35.9 C    Last Pain:  Vitals:   04/25/16 1528  TempSrc: Tympanic  PainSc:                  [name]

## 2016-04-28 ENCOUNTER — Encounter: Payer: Self-pay | Admitting: Unknown Physician Specialty

## 2016-04-28 LAB — SURGICAL PATHOLOGY

## 2016-04-28 NOTE — Discharge Summary (Signed)
[facility] [place] NAME: Jay Dougherty    MR#:  [id number]  DATE OF BIRTH:  07-06-1950  DATE OF ADMISSION:  04/22/2016   ADMITTING PHYSICIAN: Nicholes Mango, MD  DATE OF DISCHARGE: 04/25/2016  5:50 PM  PRIMARY CARE PHYSICIAN: Volanda Napoleon, MD   ADMISSION DIAGNOSIS:   Heme positive stool [R19.5] Symptomatic anemia [D64.9] Chest pain, unspecified type [R07.9]  DISCHARGE DIAGNOSIS:   Active Problems:   Symptomatic anemia   SECONDARY DIAGNOSIS:   Past Medical History:  Diagnosis Date  . Chronic hepatitis C ([place])    s/p treatment with Harvoni x 12 weeks completed 10/2014, SVR maintained at 48 weeks.  . Coronary artery disease   . Hypertension   . Peripheral vascular disease [facility])     HOSPITAL COURSE:   65 year old male with past medical history significant for CAD, peripheral vascular disease, hypertension, chronic hepatitis presented to hospital secondary to symptomatic anemia.  #1 Symptomatic anemia-hemoglobin at 7 on admission. Guaiac was positive.  - EGD with gastritis, no active bleeding. Colonoscopy normal this admission - Added PPI and carafate -Appreciate GI consult. For colonoscopy today. -Advance diet after that -Received 2 units packed RBC transfusion since admission. Hemoglobin >9 this morning. -Also hold Celebrex for arthritis  #2 CAD and peripheral vascular disease status post stents -Has hip pain radiating down to the leg secondary to his PVD. Follows up with vascular as an outpatient.  -Hold Plavix at discharge. Restarted aspirin.-Continue Lipitor  #3 hypertension-blood pressure is elevated. Adjusted toprol dose (esp bradycardia with high doses). Added Norvasc. Improved BP  #4 hyperlipidemia-on statin  Discharge home as stable   DISCHARGE CONDITIONS:   Stable  CONSULTS OBTAINED:   Treatment Team:  Manya Silvas, MD  DRUG ALLERGIES:   No Known Allergies DISCHARGE  MEDICATIONS:     Medication List    STOP taking these medications   celecoxib 200 MG capsule Commonly known as:  CELEBREX   clopidogrel 75 MG tablet Commonly known as:  PLAVIX     TAKE these medications   amLODipine 5 MG tablet Commonly known as:  NORVASC Take 1 tablet (5 mg total) by mouth daily.   aspirin 81 MG tablet Take 81 mg by mouth daily.   atorvastatin 40 MG tablet Commonly known as:  LIPITOR Take 40 mg by mouth daily at 6 PM.   cilostazol 100 MG tablet Commonly known as:  PLETAL Take 100 mg by mouth 2 (two) times daily.   metoprolol succinate 50 MG 24 hr tablet Commonly known as:  TOPROL-XL Take 1 tablet (50 mg total) by mouth every evening. What changed:  medication strength  how much to take   pantoprazole 40 MG tablet Commonly known as:  PROTONIX Take 40 mg by mouth every evening.   sucralfate 1 g tablet Commonly known as:  CARAFATE Take 1 tablet (1 g total) by mouth 4 (four) times daily -  with meals and at bedtime.        DISCHARGE INSTRUCTIONS:   1. PCP f/u in 1-2 weeks 2. CBC check in 1 week 3. GI f/u in 2-3 weeks  DIET:   Cardiac diet  ACTIVITY:   Activity as tolerated  OXYGEN:   Home Oxygen: No.  Oxygen Delivery: room air  DISCHARGE LOCATION:   home   If you experience worsening of your admission symptoms, develop shortness of breath, life threatening emergency, suicidal or homicidal thoughts you must seek medical attention immediately by calling  911 or calling your MD immediately  if symptoms less severe.  You Must read complete instructions/literature along with all the possible adverse reactions/side effects for all the Medicines you take and that have been prescribed to you. Take any new Medicines after you have completely understood and accpet all the possible adverse reactions/side effects.   Please note  You were cared for by a hospitalist during your hospital stay. If you have any questions about your discharge  medications or the care you received while you were in the hospital after you are discharged, you can call the unit and asked to speak with the hospitalist on call if the hospitalist that took care of you is not available. Once you are discharged, your primary care physician will handle any further medical issues. Please note that NO REFILLS for any discharge medications will be authorized once you are discharged, as it is imperative that you return to your primary care physician (or establish a relationship with a primary care physician if you do not have one) for your aftercare needs so that they can reassess your need for medications and monitor your lab values.    On the day of Discharge:  VITAL SIGNS:   Blood pressure 138/62, pulse 68, temperature (!) 96.6 F (35.9 C), temperature source Tympanic, resp. rate 14, height 5\' 7"  (1.702 m), weight 91.2 kg (201 lb 1.6 oz), SpO2 100 %.  PHYSICAL EXAMINATION:    GENERAL:  65 y.o.-year-old patient lying in the bed with no acute distress.  EYES: Pupils equal, round, reactive to light and accommodation. No scleral icterus. Extraocular muscles intact.  HEENT: Head atraumatic, normocephalic. Oropharynx and nasopharynx clear.  NECK:  Supple, no jugular venous distention. No thyroid enlargement, no tenderness.  LUNGS: Normal breath sounds bilaterally, no wheezing, rales,rhonchi or crepitation. No use of accessory muscles of respiration.  CARDIOVASCULAR: S1, S2 normal. No murmurs, rubs, or gallops.  ABDOMEN: Soft, nontender, nondistended. Bowel sounds present. No organomegaly or mass.  EXTREMITIES: No pedal edema, cyanosis, or clubbing.  NEUROLOGIC: Cranial nerves II through XII are intact. Muscle strength 5/5 in all extremities. Sensation intact. Gait not checked.  PSYCHIATRIC: The patient is alert and oriented x 3.  SKIN: No obvious rash, lesion, or ulcer.   DATA REVIEW:   CBC  Recent Labs Lab 04/25/16 0920  WBC 6.7  HGB 9.6*  HCT 30.0*    PLT 171    Chemistries   Recent Labs Lab 04/23/16 0720 04/25/16 0920  NA 137 136  K 3.4* 4.4  CL 107 102  CO2 23 25  GLUCOSE 115* 112*  BUN 23* 20  CREATININE 1.34* 1.37*  CALCIUM 8.7* 9.4  AST 23  --   ALT 13*  --   ALKPHOS 49  --   BILITOT 1.3*  --      Microbiology Results  No results found for this or any previous visit.  RADIOLOGY:  No results found.   Management plans discussed with the patient, family and they are in agreement.  CODE STATUS:  Code Status History    Date Active Date Inactive Code Status Order ID Comments User Context   04/22/2016  7:21 PM 04/25/2016  8:50 PM Full Code [id number]  Nicholes Mango, MD Inpatient    Advance Directive Documentation   Flowsheet Row Most Recent Value  Type of Advance Directive  Living will, Healthcare Power of Attorney  Pre-existing out of facility DNR order (yellow form or pink MOST form)  No data  "MOST" Form in  Place?  No data      TOTAL TIME TAKING CARE OF THIS PATIENT: 38 minutes.    Gladstone Lighter M.D on 04/28/2016 at 1:55 PM  Between 7am to 6pm - Pager - [phone]  After 6pm go to [email or web address] - Proofreader  [facility]  Office  [phone]  CC: Primary care physician; Volanda Napoleon, MD   Note: This dictation was prepared with Dragon dictation along with smaller phrase technology. Any transcriptional errors that result from this process are unintentional.

## 2016-05-02 NOTE — Anesthesia Postprocedure Evaluation (Signed)
Anesthesia Post Note  Patient: Jay Dougherty  Procedure(s) Performed: Procedure(s) (LRB): ESOPHAGOGASTRODUODENOSCOPY (EGD) WITH PROPOFOL (N/A)  Patient location during evaluation: PACU Anesthesia Type: General Level of consciousness: awake and alert Pain management: pain level controlled Vital Signs Assessment: post-procedure vital signs reviewed and stable Respiratory status: spontaneous breathing, nonlabored ventilation, respiratory function stable and patient connected to nasal cannula oxygen Cardiovascular status: blood pressure returned to baseline and stable Postop Assessment: no signs of nausea or vomiting Anesthetic complications: no    Last Vitals:  Vitals:   04/25/16 1528 04/25/16 1715  BP: 115/61 138/62  Pulse: (!) 52 68  Resp:    Temp: (!) 35.9 C     Last Pain:  Vitals:   04/25/16 1528  TempSrc: Tympanic  PainSc:                  Molli Barrows

## 2016-07-03 ENCOUNTER — Inpatient Hospital Stay: Payer: 59 | Attending: Hematology and Oncology | Admitting: Hematology and Oncology

## 2016-07-03 ENCOUNTER — Inpatient Hospital Stay: Payer: 59

## 2016-07-03 VITALS — BP 133/77 | HR 65 | Temp 96.7°F | Resp 18 | Wt 208.8 lb

## 2016-07-03 DIAGNOSIS — I8500 Esophageal varices without bleeding: Secondary | ICD-10-CM

## 2016-07-03 DIAGNOSIS — Z79899 Other long term (current) drug therapy: Secondary | ICD-10-CM

## 2016-07-03 DIAGNOSIS — K649 Unspecified hemorrhoids: Secondary | ICD-10-CM | POA: Diagnosis not present

## 2016-07-03 DIAGNOSIS — R5383 Other fatigue: Secondary | ICD-10-CM

## 2016-07-03 DIAGNOSIS — D509 Iron deficiency anemia, unspecified: Secondary | ICD-10-CM | POA: Diagnosis not present

## 2016-07-03 DIAGNOSIS — Z87891 Personal history of nicotine dependence: Secondary | ICD-10-CM | POA: Diagnosis not present

## 2016-07-03 DIAGNOSIS — I739 Peripheral vascular disease, unspecified: Secondary | ICD-10-CM | POA: Diagnosis not present

## 2016-07-03 DIAGNOSIS — Z7982 Long term (current) use of aspirin: Secondary | ICD-10-CM

## 2016-07-03 DIAGNOSIS — K2970 Gastritis, unspecified, without bleeding: Secondary | ICD-10-CM

## 2016-07-03 DIAGNOSIS — B182 Chronic viral hepatitis C: Secondary | ICD-10-CM

## 2016-07-03 DIAGNOSIS — R0602 Shortness of breath: Secondary | ICD-10-CM | POA: Insufficient documentation

## 2016-07-03 DIAGNOSIS — D649 Anemia, unspecified: Secondary | ICD-10-CM

## 2016-07-03 DIAGNOSIS — I10 Essential (primary) hypertension: Secondary | ICD-10-CM | POA: Diagnosis not present

## 2016-07-03 DIAGNOSIS — M7989 Other specified soft tissue disorders: Secondary | ICD-10-CM | POA: Diagnosis not present

## 2016-07-03 DIAGNOSIS — R531 Weakness: Secondary | ICD-10-CM | POA: Diagnosis not present

## 2016-07-03 DIAGNOSIS — I2510 Atherosclerotic heart disease of native coronary artery without angina pectoris: Secondary | ICD-10-CM

## 2016-07-03 LAB — CBC WITH DIFFERENTIAL/PLATELET
Basophils Absolute: 0.1 10*3/uL (ref 0–0.1)
Basophils Relative: 2 %
Eosinophils Absolute: 0.5 10*3/uL (ref 0–0.7)
Eosinophils Relative: 11 %
HCT: 28.6 % — ABNORMAL LOW (ref 40.0–52.0)
Hemoglobin: 9 g/dL — ABNORMAL LOW (ref 13.0–18.0)
Lymphocytes Relative: 22 %
Lymphs Abs: 0.9 10*3/uL — ABNORMAL LOW (ref 1.0–3.6)
MCH: 22.7 pg — ABNORMAL LOW (ref 26.0–34.0)
MCHC: 31.5 g/dL — ABNORMAL LOW (ref 32.0–36.0)
MCV: 71.8 fL — ABNORMAL LOW (ref 80.0–100.0)
Monocytes Absolute: 0.5 10*3/uL (ref 0.2–1.0)
Monocytes Relative: 11 %
Neutro Abs: 2.3 10*3/uL (ref 1.4–6.5)
Neutrophils Relative %: 54 %
Platelets: 145 10*3/uL — ABNORMAL LOW (ref 150–440)
RBC: 3.99 MIL/uL — ABNORMAL LOW (ref 4.40–5.90)
RDW: 22.7 % — ABNORMAL HIGH (ref 11.5–14.5)
WBC: 4.3 10*3/uL (ref 3.8–10.6)

## 2016-07-03 LAB — URINALYSIS, COMPLETE (UACMP) WITH MICROSCOPIC
Bacteria, UA: NONE SEEN
Bilirubin Urine: NEGATIVE
Glucose, UA: NEGATIVE mg/dL
Hgb urine dipstick: NEGATIVE
Ketones, ur: 5 mg/dL — AB
Leukocytes, UA: NEGATIVE
Nitrite: NEGATIVE
Protein, ur: 30 mg/dL — AB
Specific Gravity, Urine: 1.025 (ref 1.005–1.030)
pH: 5 (ref 5.0–8.0)

## 2016-07-03 LAB — IRON AND TIBC
Iron: 15 ug/dL — ABNORMAL LOW (ref 45–182)
Saturation Ratios: 2 % — ABNORMAL LOW (ref 17.9–39.5)
TIBC: 623 ug/dL — ABNORMAL HIGH (ref 250–450)
UIBC: 608 ug/dL

## 2016-07-03 LAB — SAMPLE TO BLOOD BANK

## 2016-07-03 LAB — RETICULOCYTES
RBC.: 3.99 MIL/uL — ABNORMAL LOW (ref 4.40–5.90)
Retic Count, Absolute: 75.8 10*3/uL (ref 19.0–183.0)
Retic Ct Pct: 1.9 % (ref 0.4–3.1)

## 2016-07-03 LAB — FERRITIN: Ferritin: 10 ng/mL — ABNORMAL LOW (ref 24–336)

## 2016-07-03 LAB — SEDIMENTATION RATE: Sed Rate: 40 mm/hr — ABNORMAL HIGH (ref 0–20)

## 2016-07-03 LAB — DAT, POLYSPECIFIC AHG (ARMC ONLY): Polyspecific AHG test: NEGATIVE

## 2016-07-03 NOTE — Progress Notes (Signed)
Patient here as add on regarding symptomatic anemia.  Referred by Tammi Klippel.  Patient states he was placed on oral iron but was unable to tolerate it.  Also states he gets SOB.  Nausea is more prevalent.

## 2016-07-03 NOTE — Patient Instructions (Signed)
Iron Sucrose injection What is this medicine? IRON SUCROSE (AHY ern SOO krohs) is an iron complex. Iron is used to make healthy red blood cells, which carry oxygen and nutrients throughout the body. This medicine is used to treat iron deficiency anemia in people with chronic kidney disease. This medicine may be used for other purposes; ask your health care provider or pharmacist if you have questions. COMMON BRAND NAME(S): Venofer What should I tell my health care provider before I take this medicine? They need to know if you have any of these conditions: -anemia not caused by low iron levels -heart disease -high levels of iron in the blood -kidney disease -liver disease -an unusual or allergic reaction to iron, other medicines, foods, dyes, or preservatives -pregnant or trying to get pregnant -breast-feeding How should I use this medicine? This medicine is for infusion into a vein. It is given by a health care professional in a hospital or clinic setting. Talk to your pediatrician regarding the use of this medicine in children. While this drug may be prescribed for children as young as 2 years for selected conditions, precautions do apply. Overdosage: If you think you have taken too much of this medicine contact a poison control center or emergency room at once. NOTE: This medicine is only for you. Do not share this medicine with others. What if I miss a dose? It is important not to miss your dose. Call your doctor or health care professional if you are unable to keep an appointment. What may interact with this medicine? Do not take this medicine with any of the following medications: -deferoxamine -dimercaprol -other iron products This medicine may also interact with the following medications: -chloramphenicol -deferasirox This list may not describe all possible interactions. Give your health care provider a list of all the medicines, herbs, non-prescription drugs, or dietary  supplements you use. Also tell them if you smoke, drink alcohol, or use illegal drugs. Some items may interact with your medicine. What should I watch for while using this medicine? Visit your doctor or healthcare professional regularly. Tell your doctor or healthcare professional if your symptoms do not start to get better or if they get worse. You may need blood work done while you are taking this medicine. You may need to follow a special diet. Talk to your doctor. Foods that contain iron include: whole grains/cereals, dried fruits, beans, or peas, leafy green vegetables, and organ meats (liver, kidney). What side effects may I notice from receiving this medicine? Side effects that you should report to your doctor or health care professional as soon as possible: -allergic reactions like skin rash, itching or hives, swelling of the face, lips, or tongue -breathing problems -changes in blood pressure -cough -fast, irregular heartbeat -feeling faint or lightheaded, falls -fever or chills -flushing, sweating, or hot feelings -joint or muscle aches/pains -seizures -swelling of the ankles or feet -unusually weak or tired Side effects that usually do not require medical attention (report to your doctor or health care professional if they continue or are bothersome): -diarrhea -feeling achy -headache -irritation at site where injected -nausea, vomiting -stomach upset -tiredness This list may not describe all possible side effects. Call your doctor for medical advice about side effects. You may report side effects to FDA at 1-800-FDA-1088. Where should I keep my medicine? This drug is given in a hospital or clinic and will not be stored at home. NOTE: This sheet is a summary. It may not cover all possible information. If   you have questions about this medicine, talk to your doctor, pharmacist, or health care provider.  2017 Elsevier/Gold Standard (2011-03-20 17:14:35)  

## 2016-07-03 NOTE — Progress Notes (Signed)
[facility] day:  07/03/2016  Chief Complaint: Jay Dougherty is a 66 y.o. male with symptomatic anemia who is referred in consultation by Tammi Klippel, PA for assessment and management.  HPI:  The patient was admitted to [facility] from 04/22/2016 - 04/25/2016 with symptomatic anemia.  CBC revealed a hematocrit of 25.1, hemoglobin 7.6, MCV 63.6, platelets 152,000, and WBC 5100.  Ferritin was 24 on 04/25/2016.  Iron studies included a 7% iron saturation and a TIBC of 689 (high). Folate was 18.4.  B12 was 362.  He received 2 units of PRBCs.  Stool was guaiac positive.    EGD on 04/24/2016 by Dr. Gaylyn Cheers revealed patchy mild inflammation characterized by erosions, erythema and granularity in the gastric fundus.  There was grade I non-bleeding esophageal varices.  Colonoscopy on 04/25/2016 only revealed internal hemorrhoids.  PPI and carafate were instituted.   A capsule study is planned.   CBC on 04/25/2016 revealed a hematocrit of 30.0, hemoglobin 9.6, MCV 66.8, platelets 171,000, WBC 6700.  Two to three weeks ago, he felt "poorly all over".  Stool was "dark but not as black".  Hemoglobin was 8.4.  Stool was guaiac + on 06/30/2016.  Regarding his diet, he tries to eat a balanced diet.  He notes "no taste buds".  He does not eat junk food. He eats some red meat 2-3 times a week. He does not eat much green leafy vegetables.  He tried 1 week of oral iron.  It made him violently ill.  He denies any pica.  Symptomatically, the patient states that he has "been better".   He is fatigued.  He feels cold and clammy.  He has shortness of breath with exertion.  He comments that his legs swell up when is hematocrit is down.  He denies any hematuria.   Past Medical History:  Diagnosis Date  . Chronic hepatitis C ([place])    s/p treatment with Harvoni x 12 weeks completed 10/2014, SVR maintained at 48 weeks.  . Coronary artery disease   . Hypertension   .  Peripheral vascular disease [facility])     Past Surgical History:  Procedure Laterality Date  . CAROTID ENDARTERECTOMY Right   . COLONOSCOPY WITH PROPOFOL N/A 04/25/2016   Procedure: COLONOSCOPY WITH PROPOFOL;  Surgeon: Manya Silvas, MD;  Location: [facility] ENDOSCOPY;  Service: Endoscopy;  Laterality: N/A;  . ESOPHAGOGASTRODUODENOSCOPY (EGD) WITH PROPOFOL N/A 04/24/2016   Procedure: ESOPHAGOGASTRODUODENOSCOPY (EGD) WITH PROPOFOL;  Surgeon: Manya Silvas, MD;  Location: [facility] ENDOSCOPY;  Service: Endoscopy;  Laterality: N/A;  . PALATE / UVULA BIOPSY / EXCISION     removal  . PERIPHERAL VASCULAR CATHETERIZATION N/A 09/24/2015   Procedure: Abdominal Aortogram w/Lower Extremity;  Surgeon: Algernon Huxley, MD;  Location: [place] CV LAB;  Service: Cardiovascular;  Laterality: N/A;  . PERIPHERAL VASCULAR CATHETERIZATION  09/24/2015   Procedure: Lower Extremity Intervention;  Surgeon: Algernon Huxley, MD;  Location: [place] CV LAB;  Service: Cardiovascular;;  . PERIPHERAL VASCULAR CATHETERIZATION Right 10/29/2015   Procedure: Lower Extremity Angiography;  Surgeon: Algernon Huxley, MD;  Location: [place] CV LAB;  Service: Cardiovascular;  Laterality: Right;  . PERIPHERAL VASCULAR CATHETERIZATION  10/29/2015   Procedure: Lower Extremity Intervention;  Surgeon: Algernon Huxley, MD;  Location: [place] CV LAB;  Service: Cardiovascular;;    No family history on file.  Social History:  reports that he has quit smoking. He has never used smokeless tobacco. He reports  that he drinks about 0.6 oz of alcohol per week . He reports that he does not use drugs.  He lives in [place].  The patient is alone today.  Allergies: No Known Allergies  Current Medications: Current Outpatient Prescriptions  Medication Sig Dispense Refill  . aspirin 81 MG tablet Take 81 mg by mouth daily.    Marland Kitchen atorvastatin (LIPITOR) 40 MG tablet Take 40 mg by mouth daily at 6 PM.     . cilostazol (PLETAL) 100 MG tablet Take 100 mg by  mouth 2 (two) times daily.    . metoprolol succinate (TOPROL-XL) 50 MG 24 hr tablet Take 1 tablet (50 mg total) by mouth every evening. 30 tablet 2  . pantoprazole (PROTONIX) 40 MG tablet Take 40 mg by mouth every evening.    Marland Kitchen amLODipine-benazepril (LOTREL) 5-20 MG capsule Take 5-20 capsules by mouth daily.    . sucralfate (CARAFATE) 1 g tablet Take 1 tablet (1 g total) by mouth 4 (four) times daily -  with meals and at bedtime. (Patient not taking: Reported on 07/03/2016) 120 tablet 0   No current facility-administered medications for this visit.     Review of Systems:  GENERAL:  Fatigue.  No fevers or sweats.  Feels cold/clammy.  No weight loss. PERFORMANCE STATUS (ECOG):  1 HEENT:  No visual changes, runny nose, sore throat, mouth sores or tenderness. Lungs:  Shortness of breath with exertion.  No cough.  No hemoptysis. Cardiac:  No chest pain, palpitations, orthopnea, or PND. GI:  No nausea, vomiting, diarrhea, constipation, melena or hematochezia. GU:  No urgency, frequency, dysuria, or hematuria. Musculoskeletal:  Pain in hips and legs.  No muscle tenderness. Extremities:  Legs swell when hematocrit is low. Skin:  No rashes or skin changes. Neuro:  No headache, numbness or weakness, balance or coordination issues. Endocrine:  No diabetes, thyroid issues, hot flashes or night sweats. Psych:  No mood changes, depression or anxiety. Pain:  No focal pain. Review of systems:  All other systems reviewed and found to be negative.  Physical Exam: Blood pressure 133/77, pulse 65, temperature (!) 96.7 F (35.9 C), temperature source Tympanic, resp. rate 18, weight 208 lb 12.4 oz (94.7 kg). GENERAL:  Well developed, well nourished, gentleman sitting comfortably in the exam room in no acute distress. MENTAL STATUS:  Alert and oriented to person, place and time. HEAD: Pearline Cables hair.  Mustache.  Normocephalic, atraumatic, face symmetric, no Cushingoid features. EYES:  Glasses.  Blue eyes.  Pupils  equal round and reactive to light and accomodation.  No conjunctivitis or scleral icterus. ENT:  Oropharynx clear without lesion.  Tongue normal. Mucous membranes moist.  RESPIRATORY:  Clear to auscultation without rales, wheezes or rhonchi. CARDIOVASCULAR:  Regular rate and rhythm without murmur, rub or gallop. ABDOMEN:  Soft, non-tender, with active bowel sounds, and no appreciable hepatosplenomegaly.  Left upper quadrant full.  No masses. SKIN:  Right carotid scar.  No rashes, ulcers or lesions. EXTREMITIES: No edema, no skin discoloration or tenderness.  No palpable cords. LYMPH NODES: No palpable cervical, supraclavicular, axillary or inguinal adenopathy  NEUROLOGICAL: Unremarkable. PSYCH:  Appropriate.   No visits with results within 3 Day(s) from this visit.  Latest known visit with results is:  Admission on 04/22/2016, Discharged on 04/25/2016  Component Date Value Ref Range Status  . Sodium 04/22/2016 134* 135 - 145 mmol/L Final  . Potassium 04/22/2016 3.8  3.5 - 5.1 mmol/L Final  . Chloride 04/22/2016 104  101 - 111 mmol/L Final  .  CO2 04/22/2016 21* 22 - 32 mmol/L Final  . Glucose, Bld 04/22/2016 121* 65 - 99 mg/dL Final  . BUN 35/59/9768 22* 6 - 20 mg/dL Final  . Creatinine, Ser 04/22/2016 1.17  0.61 - 1.24 mg/dL Final  . Calcium 23/57/7561 8.8* 8.9 - 10.3 mg/dL Final  . GFR calc non Af Amer 04/22/2016 >60  >60 mL/min Final  . GFR calc Af Amer 04/22/2016 >60  >60 mL/min Final   Comment: (NOTE) The eGFR has been calculated using the CKD EPI equation. This calculation has not been validated in all clinical situations. eGFR's persistently <60 mL/min signify possible Chronic Kidney Disease.   . Anion gap 04/22/2016 9  5 - 15 Final  . WBC 04/22/2016 5.1  3.8 - 10.6 K/uL Final  . RBC 04/22/2016 3.94* 4.40 - 5.90 MIL/uL Final  . Hemoglobin 04/22/2016 7.6* 13.0 - 18.0 g/dL Final  . HCT 97/18/5692 25.1* 40.0 - 52.0 % Final  . MCV 04/22/2016 63.6* 80.0 - 100.0 fL Final  . MCH  04/22/2016 19.2* 26.0 - 34.0 pg Final  . MCHC 04/22/2016 30.2* 32.0 - 36.0 g/dL Final  . RDW 69/97/8746 20.9* 11.5 - 14.5 % Final  . Platelets 04/22/2016 152  150 - 440 K/uL Final  . Troponin I 04/22/2016 <0.03  <0.03 ng/mL Final  . Order Confirmation 04/22/2016 ORDER PROCESSED BY BLOOD BANK   Final  . ABO/RH(D) 04/24/2016 O POS   Final  . Antibody Screen 04/24/2016 NEG   Final  . Sample Expiration 04/24/2016 04/25/2016   Final  . Unit Number 04/24/2016 [id number]   Final  . Blood Component Type 04/24/2016 RED CELLS,LR   Final  . Unit division 04/24/2016 00   Final  . Status of Unit 04/24/2016 ISSUED,FINAL   Final  . Transfusion Status 04/24/2016 OK TO TRANSFUSE   Final  . Crossmatch Result 04/24/2016 Compatible   Final  . Unit Number 04/24/2016 [id number]   Final  . Blood Component Type 04/24/2016 RCLI PHER 2   Final  . Unit division 04/24/2016 00   Final  . Status of Unit 04/24/2016 ISSUED,FINAL   Final  . Transfusion Status 04/24/2016 OK TO TRANSFUSE   Final  . Crossmatch Result 04/24/2016 Compatible   Final  . ABO/RH(D) 04/22/2016 O POS   Final  . Weight 04/23/2016 3380.8  oz Final  . Height 04/23/2016 67  in Final  . BP 04/23/2016 147/57  mmHg Final  . Troponin I 04/22/2016 <0.03  <0.03 ng/mL Final  . Troponin I 04/23/2016 <0.03  <0.03 ng/mL Final  . Troponin I 04/23/2016 <0.03  <0.03 ng/mL Final  . Fecal Occult Bld 04/22/2016 POSITIVE* NEGATIVE Final  . Sodium 04/23/2016 137  135 - 145 mmol/L Final  . Potassium 04/23/2016 3.4* 3.5 - 5.1 mmol/L Final  . Chloride 04/23/2016 107  101 - 111 mmol/L Final  . CO2 04/23/2016 23  22 - 32 mmol/L Final  . Glucose, Bld 04/23/2016 115* 65 - 99 mg/dL Final  . BUN 50/40/3060 23* 6 - 20 mg/dL Final  . Creatinine, Ser 04/23/2016 1.34* 0.61 - 1.24 mg/dL Final  . Calcium 67/15/1951 8.7* 8.9 - 10.3 mg/dL Final  . Total Protein 04/23/2016 7.3  6.5 - 8.1 g/dL Final  . Albumin 11/35/6527 3.9  3.5 - 5.0 g/dL Final  . AST 80/24/4329 23  15  - 41 U/L Final  . ALT 04/23/2016 13* 17 - 63 U/L Final  . Alkaline Phosphatase 04/23/2016 49  38 - 126 U/L Final  . Total  Bilirubin 04/23/2016 1.3* 0.3 - 1.2 mg/dL Final  . GFR calc non Af Amer 04/23/2016 54* >60 mL/min Final  . GFR calc Af Amer 04/23/2016 >60  >60 mL/min Final   Comment: (NOTE) The eGFR has been calculated using the CKD EPI equation. This calculation has not been validated in all clinical situations. eGFR's persistently <60 mL/min signify possible Chronic Kidney Disease.   . Anion gap 04/23/2016 7  5 - 15 Final  . WBC 04/23/2016 4.9  3.8 - 10.6 K/uL Final  . RBC 04/23/2016 3.85* 4.40 - 5.90 MIL/uL Final  . Hemoglobin 04/23/2016 7.7* 13.0 - 18.0 g/dL Final  . HCT 04/23/2016 24.8* 40.0 - 52.0 % Final  . MCV 04/23/2016 64.5* 80.0 - 100.0 fL Final  . MCH 04/23/2016 20.1* 26.0 - 34.0 pg Final  . MCHC 04/23/2016 31.2* 32.0 - 36.0 g/dL Final  . RDW 04/23/2016 22.3* 11.5 - 14.5 % Final  . Platelets 04/23/2016 139* 150 - 440 K/uL Final  . Prothrombin Time 04/23/2016 15.2  11.4 - 15.2 seconds Final  . INR 04/23/2016 1.19   Final  . Hemoglobin 04/22/2016 6.7* 13.0 - 18.0 g/dL Final  . HCT 04/22/2016 22.6* 40.0 - 52.0 % Final  . Hemoglobin 04/23/2016 7.9* 13.0 - 18.0 g/dL Final  . HCT 04/23/2016 26.5* 40.0 - 52.0 % Final  . Hemoglobin 04/23/2016 7.7* 13.0 - 18.0 g/dL Final  . Order Confirmation 04/23/2016 ORDER PROCESSED BY BLOOD BANK   Final  . Hemoglobin 04/24/2016 8.7* 13.0 - 18.0 g/dL Final  . Hemoglobin 04/24/2016 8.8* 13.0 - 18.0 g/dL Final  . HCT 04/24/2016 28.5* 40.0 - 52.0 % Final  . SURGICAL PATHOLOGY 04/28/2016    Final                   Value:Surgical Pathology CASE: [id number] PATIENT: Jay Dougherty Surgical Pathology Report     SPECIMEN SUBMITTED: A. Duodenum polyp, small distal bulb; cbx  CLINICAL HISTORY: None provided  PRE-OPERATIVE DIAGNOSIS: Anemia, melena  POST-OPERATIVE DIAGNOSIS: Small varices, small duodenal polyp distal bulb,  gastritis proximal stomach     DIAGNOSIS: A. DUODENUM POLYP, DISTAL BULB; COLD BIOPSY: - SMALL SUPERFICIAL SAMPLING OF DUODENAL MUCOSA WITH REGENERATIVE EPITHELIAL CHANGES. - NEGATIVE FOR DYSPLASIA AND MALIGNANCY.   GROSS DESCRIPTION:  A. Labeled: small duodenal polyp distal polyp C BX  Tissue fragment(s): 1  Size: 0.3 cm  Description: tan to brown fragment  Entirely submitted in one cassette(s).    Final Diagnosis performed by Quay Burow, MD.  Electronically signed 04/28/2016 10:18:53AM    The electronic signature indicates that the named Attending Pathologist has evaluated the specimen  Technical component performed at [place], [place], [place], [place] [address] Lab: [phone] Dir: Darrick Penna. Evette Doffing, MD  Professional component performed at [facility], [facility], [place], [place], [place] [address] Lab: [phone] Dir: Dellia Nims. Rubinas, MD    . Glucose-Capillary 04/24/2016 95  65 - 99 mg/dL Final  . WBC 04/25/2016 6.7  3.8 - 10.6 K/uL Final  . RBC 04/25/2016 4.50  4.40 - 5.90 MIL/uL Final  . Hemoglobin 04/25/2016 9.6* 13.0 - 18.0 g/dL Final  . HCT 04/25/2016 30.0* 40.0 - 52.0 % Final  . MCV 04/25/2016 66.8* 80.0 - 100.0 fL Final  . MCH 04/25/2016 21.3* 26.0 - 34.0 pg Final  . MCHC 04/25/2016 31.8* 32.0 - 36.0  g/dL Final  . RDW 04/25/2016 24.2* 11.5 - 14.5 % Final  . Platelets 04/25/2016 171  150 - 440 K/uL Final  . Sodium 04/25/2016 136  135 - 145 mmol/L Final  . Potassium 04/25/2016 4.4  3.5 - 5.1 mmol/L Final  . Chloride 04/25/2016 102  101 - 111 mmol/L Final  . CO2 04/25/2016 25  22 - 32 mmol/L Final  . Glucose, Bld 04/25/2016 112* 65 - 99 mg/dL Final  . BUN 04/25/2016 20  6 - 20 mg/dL Final  . Creatinine, Ser 04/25/2016 1.37* 0.61 - 1.24 mg/dL Final  . Calcium 04/25/2016 9.4  8.9 - 10.3 mg/dL Final  . GFR calc non Af Amer 04/25/2016 53* >60 mL/min Final  . GFR calc Af Amer 04/25/2016 >60   >60 mL/min Final   Comment: (NOTE) The eGFR has been calculated using the CKD EPI equation. This calculation has not been validated in all clinical situations. eGFR's persistently <60 mL/min signify possible Chronic Kidney Disease.   . Anion gap 04/25/2016 9  5 - 15 Final  . Vitamin B-12 04/25/2016 362  180 - 914 pg/mL Final   Comment: (NOTE) This assay is not validated for testing neonatal or myeloproliferative syndrome specimens for Vitamin B12 levels. Performed at [facility]   . Folate 04/25/2016 18.4  >5.9 ng/mL Final  . Iron 04/25/2016 50  45 - 182 ug/dL Final  . TIBC 04/25/2016 689* 250 - 450 ug/dL Final  . Saturation Ratios 04/25/2016 7* 17.9 - 39.5 % Final  . UIBC 04/25/2016 639  ug/dL Final  . Ferritin 04/25/2016 24  24 - 336 ng/mL Final  . Retic Ct Pct 04/25/2016 3.3* 0.4 - 3.1 % Final  . RBC. 04/25/2016 4.50  4.40 - 5.90 MIL/uL Final  . Retic Count, Manual 04/25/2016 148.5  19.0 - 183.0 K/uL Final    Assessment:  Jay Dougherty is a 67 y.o. male with anemia likely secondary to iron deficiency.  Diet is fair.  He is intolerant of oral iron.  He was admitted to [facility] from 04/22/2016 - 04/25/2016 with symptomatic anemia.  CBC revealed a hematocrit of 25.1, hemoglobin 7.6, and MCV 63.6.  Ferritin was 24 on 04/25/2016.  Iron studies included a 7% iron saturation and a TIBC of 689 (high). B12 and folate were normal. He received 2 units of PRBCs.  Stool was guaiac positive.    EGD on 04/24/2016  revealed patchy mild inflammation characterized by erosions, erythema and granularity in the gastric fundus.  There was grade I non-bleeding esophageal varices.  Colonoscopy on 04/25/2016 only revealed internal hemorrhoids.  PPI and carafate were instituted.   A capsule study is planned.   Symptomatically, he is fatigued.  Exam reveals LUQ fullness.  Plan: 1.  Review diagnosis and management of iron deficiency anemia.  Discuss gastritis noted on EGD and treatment.  Discuss  plans for capsule study per patient.  Discuss IV iron as patient intolerant to oral iron.  Discuss risks of IV iron.  Information on Venofer today.  Await preauthorization prior to initiation. 2.  Labs today:  CBC with diff, ferritin, iron studies, retic, SPEP, free light chains, DAT, hold tube. 3.  Urinalysis- r/o hematuria. 4.  RTC on 07/04/2016 for Venofer 5.  RTC weekly x 3 for Venofer on Fridays (01/19, 01/26, 02/02).  6.  CBC on 07/11/2016. 7.  RTC on 08/01/2016 for MD assessment, labs (CBC with diff, ferritin- day before) and +/- Venofer   Lequita Asal, MD  07/03/2016, 3:55  PM  

## 2016-07-04 ENCOUNTER — Inpatient Hospital Stay: Payer: 59

## 2016-07-04 VITALS — BP 153/82 | HR 55 | Temp 97.8°F | Resp 18

## 2016-07-04 DIAGNOSIS — D649 Anemia, unspecified: Secondary | ICD-10-CM

## 2016-07-04 DIAGNOSIS — D509 Iron deficiency anemia, unspecified: Secondary | ICD-10-CM | POA: Diagnosis not present

## 2016-07-04 LAB — KAPPA/LAMBDA LIGHT CHAINS
Kappa free light chain: 42.1 mg/L — ABNORMAL HIGH (ref 3.3–19.4)
Kappa, lambda light chain ratio: 1.45 (ref 0.26–1.65)
Lambda free light chains: 29.1 mg/L — ABNORMAL HIGH (ref 5.7–26.3)

## 2016-07-04 MED ORDER — SODIUM CHLORIDE 0.9 % IV SOLN
Freq: Once | INTRAVENOUS | Status: AC
  Administered 2016-07-04: 11:00:00 via INTRAVENOUS
  Filled 2016-07-04: qty 1000

## 2016-07-04 MED ORDER — IRON SUCROSE 20 MG/ML IV SOLN
200.0000 mg | Freq: Once | INTRAVENOUS | Status: AC
  Administered 2016-07-04: 200 mg via INTRAVENOUS
  Filled 2016-07-04: qty 10

## 2016-07-04 MED ORDER — SODIUM CHLORIDE 0.9 % IV SOLN: 200.0000 mg | Freq: Once | INTRAVENOUS | Status: DC

## 2016-07-07 LAB — PROTEIN ELECTROPHORESIS, SERUM
A/G Ratio: 1 (ref 0.7–1.7)
Albumin ELP: 3.7 g/dL (ref 2.9–4.4)
Alpha-1-Globulin: 0.3 g/dL (ref 0.0–0.4)
Alpha-2-Globulin: 0.8 g/dL (ref 0.4–1.0)
Beta Globulin: 1.2 g/dL (ref 0.7–1.3)
Gamma Globulin: 1.5 g/dL (ref 0.4–1.8)
Globulin, Total: 3.8 g/dL (ref 2.2–3.9)
PDF: 0
Total Protein ELP: 7.5 g/dL (ref 6.0–8.5)

## 2016-07-11 ENCOUNTER — Inpatient Hospital Stay: Payer: 59

## 2016-07-11 VITALS — BP 124/76 | HR 72 | Temp 98.1°F | Resp 17

## 2016-07-11 DIAGNOSIS — D509 Iron deficiency anemia, unspecified: Secondary | ICD-10-CM | POA: Diagnosis not present

## 2016-07-11 DIAGNOSIS — D649 Anemia, unspecified: Secondary | ICD-10-CM

## 2016-07-11 LAB — CBC WITH DIFFERENTIAL/PLATELET
Basophils Absolute: 0.1 10*3/uL (ref 0–0.1)
Basophils Relative: 3 %
Eosinophils Absolute: 0.3 10*3/uL (ref 0–0.7)
Eosinophils Relative: 7 %
HCT: 29 % — ABNORMAL LOW (ref 40.0–52.0)
Hemoglobin: 9.2 g/dL — ABNORMAL LOW (ref 13.0–18.0)
Lymphocytes Relative: 24 %
Lymphs Abs: 1.1 10*3/uL (ref 1.0–3.6)
MCH: 22.8 pg — ABNORMAL LOW (ref 26.0–34.0)
MCHC: 31.7 g/dL — ABNORMAL LOW (ref 32.0–36.0)
MCV: 71.9 fL — ABNORMAL LOW (ref 80.0–100.0)
Monocytes Absolute: 0.5 10*3/uL (ref 0.2–1.0)
Monocytes Relative: 11 %
Neutro Abs: 2.6 10*3/uL (ref 1.4–6.5)
Neutrophils Relative %: 55 %
Platelets: 172 10*3/uL (ref 150–440)
RBC: 4.03 MIL/uL — ABNORMAL LOW (ref 4.40–5.90)
RDW: 22.3 % — ABNORMAL HIGH (ref 11.5–14.5)
WBC: 4.7 10*3/uL (ref 3.8–10.6)

## 2016-07-11 MED ORDER — SODIUM CHLORIDE 0.9 % IV SOLN: 200.0000 mg | Freq: Once | INTRAVENOUS | Status: DC

## 2016-07-11 MED ORDER — SODIUM CHLORIDE 0.9 % IV SOLN
Freq: Once | INTRAVENOUS | Status: AC
  Administered 2016-07-11: 11:00:00 via INTRAVENOUS
  Filled 2016-07-11: qty 1000

## 2016-07-11 MED ORDER — IRON SUCROSE 20 MG/ML IV SOLN
200.0000 mg | Freq: Once | INTRAVENOUS | Status: AC
  Administered 2016-07-11: 200 mg via INTRAVENOUS
  Filled 2016-07-11: qty 10

## 2016-07-18 ENCOUNTER — Inpatient Hospital Stay: Payer: 59

## 2016-07-18 VITALS — BP 102/65 | HR 64 | Temp 97.4°F | Resp 18

## 2016-07-18 DIAGNOSIS — D649 Anemia, unspecified: Secondary | ICD-10-CM

## 2016-07-18 DIAGNOSIS — D509 Iron deficiency anemia, unspecified: Secondary | ICD-10-CM | POA: Diagnosis not present

## 2016-07-18 MED ORDER — IRON SUCROSE 20 MG/ML IV SOLN
200.0000 mg | Freq: Once | INTRAVENOUS | Status: AC
  Administered 2016-07-18: 200 mg via INTRAVENOUS
  Filled 2016-07-18: qty 10

## 2016-07-18 MED ORDER — SODIUM CHLORIDE 0.9 % IV SOLN: 200.0000 mg | Freq: Once | INTRAVENOUS | Status: DC

## 2016-07-18 MED ORDER — SODIUM CHLORIDE 0.9 % IV SOLN
Freq: Once | INTRAVENOUS | Status: AC
  Administered 2016-07-18: 11:00:00 via INTRAVENOUS
  Filled 2016-07-18: qty 1000

## 2016-07-25 ENCOUNTER — Inpatient Hospital Stay: Payer: 59 | Attending: Hematology and Oncology

## 2016-07-25 VITALS — BP 109/73 | HR 65 | Temp 97.0°F | Resp 18

## 2016-07-25 DIAGNOSIS — I10 Essential (primary) hypertension: Secondary | ICD-10-CM | POA: Insufficient documentation

## 2016-07-25 DIAGNOSIS — R195 Other fecal abnormalities: Secondary | ICD-10-CM | POA: Diagnosis not present

## 2016-07-25 DIAGNOSIS — D649 Anemia, unspecified: Secondary | ICD-10-CM

## 2016-07-25 DIAGNOSIS — Z87891 Personal history of nicotine dependence: Secondary | ICD-10-CM | POA: Diagnosis not present

## 2016-07-25 DIAGNOSIS — I739 Peripheral vascular disease, unspecified: Secondary | ICD-10-CM | POA: Insufficient documentation

## 2016-07-25 DIAGNOSIS — I2510 Atherosclerotic heart disease of native coronary artery without angina pectoris: Secondary | ICD-10-CM | POA: Diagnosis not present

## 2016-07-25 DIAGNOSIS — K648 Other hemorrhoids: Secondary | ICD-10-CM | POA: Insufficient documentation

## 2016-07-25 DIAGNOSIS — B182 Chronic viral hepatitis C: Secondary | ICD-10-CM | POA: Insufficient documentation

## 2016-07-25 DIAGNOSIS — Z79899 Other long term (current) drug therapy: Secondary | ICD-10-CM | POA: Insufficient documentation

## 2016-07-25 DIAGNOSIS — Z7982 Long term (current) use of aspirin: Secondary | ICD-10-CM | POA: Insufficient documentation

## 2016-07-25 DIAGNOSIS — D509 Iron deficiency anemia, unspecified: Secondary | ICD-10-CM | POA: Diagnosis not present

## 2016-07-25 DIAGNOSIS — I8500 Esophageal varices without bleeding: Secondary | ICD-10-CM | POA: Insufficient documentation

## 2016-07-25 MED ORDER — SODIUM CHLORIDE 0.9 % IV SOLN: 200.0000 mg | Freq: Once | INTRAVENOUS | Status: DC

## 2016-07-25 MED ORDER — IRON SUCROSE 20 MG/ML IV SOLN
200.0000 mg | Freq: Once | INTRAVENOUS | Status: AC
  Administered 2016-07-25: 200 mg via INTRAVENOUS
  Filled 2016-07-25: qty 10

## 2016-07-25 MED ORDER — SODIUM CHLORIDE 0.9 % IV SOLN
Freq: Once | INTRAVENOUS | Status: AC
  Administered 2016-07-25: 11:00:00 via INTRAVENOUS
  Filled 2016-07-25: qty 1000

## 2016-07-31 ENCOUNTER — Inpatient Hospital Stay: Payer: 59

## 2016-07-31 DIAGNOSIS — D649 Anemia, unspecified: Secondary | ICD-10-CM

## 2016-07-31 DIAGNOSIS — D509 Iron deficiency anemia, unspecified: Secondary | ICD-10-CM | POA: Diagnosis not present

## 2016-07-31 LAB — CBC WITH DIFFERENTIAL/PLATELET
Basophils Absolute: 0.1 10*3/uL (ref 0–0.1)
Basophils Relative: 2 %
Eosinophils Absolute: 0.6 10*3/uL (ref 0–0.7)
Eosinophils Relative: 12 %
HCT: 35 % — ABNORMAL LOW (ref 40.0–52.0)
Hemoglobin: 11.3 g/dL — ABNORMAL LOW (ref 13.0–18.0)
Lymphocytes Relative: 29 %
Lymphs Abs: 1.3 10*3/uL (ref 1.0–3.6)
MCH: 24.4 pg — ABNORMAL LOW (ref 26.0–34.0)
MCHC: 32.4 g/dL (ref 32.0–36.0)
MCV: 75.4 fL — ABNORMAL LOW (ref 80.0–100.0)
Monocytes Absolute: 0.4 10*3/uL (ref 0.2–1.0)
Monocytes Relative: 9 %
Neutro Abs: 2.3 10*3/uL (ref 1.4–6.5)
Neutrophils Relative %: 48 %
Platelets: 193 10*3/uL (ref 150–440)
RBC: 4.64 MIL/uL (ref 4.40–5.90)
RDW: 24.3 % — ABNORMAL HIGH (ref 11.5–14.5)
WBC: 4.7 10*3/uL (ref 3.8–10.6)

## 2016-07-31 LAB — FERRITIN: Ferritin: 112 ng/mL (ref 24–336)

## 2016-08-01 ENCOUNTER — Encounter: Payer: Self-pay | Admitting: Hematology and Oncology

## 2016-08-01 ENCOUNTER — Inpatient Hospital Stay (HOSPITAL_BASED_OUTPATIENT_CLINIC_OR_DEPARTMENT_OTHER): Payer: 59 | Admitting: Hematology and Oncology

## 2016-08-01 ENCOUNTER — Inpatient Hospital Stay: Payer: 59

## 2016-08-01 VITALS — BP 111/68 | HR 60 | Temp 96.2°F | Resp 18 | Wt 196.9 lb

## 2016-08-01 DIAGNOSIS — I739 Peripheral vascular disease, unspecified: Secondary | ICD-10-CM

## 2016-08-01 DIAGNOSIS — D509 Iron deficiency anemia, unspecified: Secondary | ICD-10-CM

## 2016-08-01 DIAGNOSIS — R195 Other fecal abnormalities: Secondary | ICD-10-CM

## 2016-08-01 DIAGNOSIS — B182 Chronic viral hepatitis C: Secondary | ICD-10-CM

## 2016-08-01 DIAGNOSIS — I10 Essential (primary) hypertension: Secondary | ICD-10-CM | POA: Diagnosis not present

## 2016-08-01 DIAGNOSIS — Z7982 Long term (current) use of aspirin: Secondary | ICD-10-CM | POA: Diagnosis not present

## 2016-08-01 DIAGNOSIS — I2510 Atherosclerotic heart disease of native coronary artery without angina pectoris: Secondary | ICD-10-CM | POA: Diagnosis not present

## 2016-08-01 DIAGNOSIS — I8500 Esophageal varices without bleeding: Secondary | ICD-10-CM | POA: Diagnosis not present

## 2016-08-01 DIAGNOSIS — Z79899 Other long term (current) drug therapy: Secondary | ICD-10-CM

## 2016-08-01 DIAGNOSIS — Z87891 Personal history of nicotine dependence: Secondary | ICD-10-CM

## 2016-08-01 DIAGNOSIS — K648 Other hemorrhoids: Secondary | ICD-10-CM

## 2016-08-01 DIAGNOSIS — E611 Iron deficiency: Secondary | ICD-10-CM

## 2016-08-01 NOTE — Progress Notes (Signed)
Patient states he does not sleep well.  Appetite not good. He has nausea all the time and food does not taste good.  Exertional SOB.

## 2016-08-01 NOTE — Progress Notes (Signed)
[facility] day:  08/01/2016  Chief Complaint: Jay Dougherty is a 66 y.o. male with iron deficiency anemia who is seen for 1 month assessment after initiation of IV iron.  HPI:  The patient was last seen in the hematology clinic on 07/03/2016.  At that time, he has seen for initial consultation regarding anemia.  He appeared to have isolated iron deficiency.  Stool was guaiac positive.  EGD revealed gastritis.    He underwent a work-up.  CBC revealed a hematocrit 28.6, hemoglobin 9, MCV 71.8, platelets 145,000, white count 4300 with an ANC of 2300.  Ferritin was 10.  Iron saturation was 2% with a TIBC of 623 (high).  SPEP revealed no monoclonal protein. Kappa free light chains were 42.1, lambda free light chains 29.1 and ratio 1.45 (normal).  Sedimentation rate was 40 (0-20).  Urinalysis was negative.   As he was intolerant of oral iron, IV iron was initiated.  He received 200 mg Venofer weekly x 4 (07/04/2016 - 07/25/2016).  CBC on 07/31/2016 revealed a hematocrit of 35.0, hemoglobin 11.3, MCV 75.4, platelets 193,000, WBC 4700 with a normal differential.  Ferritin was 112.  Symptomatically, he is still feeling fatigued.  He feels a little better since his iron infusions in 06/2016, but he is not back to his old self. He denies any blood in stools or urine.    Past Medical History:  Diagnosis Date  . Chronic hepatitis C ([place])    s/p treatment with Harvoni x 12 weeks completed 10/2014, SVR maintained at 48 weeks.  . Coronary artery disease   . Hypertension   . Peripheral vascular disease [facility])     Past Surgical History:  Procedure Laterality Date  . CAROTID ENDARTERECTOMY Right   . COLONOSCOPY WITH PROPOFOL N/A 04/25/2016   Procedure: COLONOSCOPY WITH PROPOFOL;  Surgeon: Manya Silvas, MD;  Location: [facility] ENDOSCOPY;  Service: Endoscopy;  Laterality: N/A;  . ESOPHAGOGASTRODUODENOSCOPY (EGD) WITH PROPOFOL N/A 04/24/2016   Procedure:  ESOPHAGOGASTRODUODENOSCOPY (EGD) WITH PROPOFOL;  Surgeon: Manya Silvas, MD;  Location: [facility] ENDOSCOPY;  Service: Endoscopy;  Laterality: N/A;  . PALATE / UVULA BIOPSY / EXCISION     removal  . PERIPHERAL VASCULAR CATHETERIZATION N/A 09/24/2015   Procedure: Abdominal Aortogram w/Lower Extremity;  Surgeon: Algernon Huxley, MD;  Location: [place] CV LAB;  Service: Cardiovascular;  Laterality: N/A;  . PERIPHERAL VASCULAR CATHETERIZATION  09/24/2015   Procedure: Lower Extremity Intervention;  Surgeon: Algernon Huxley, MD;  Location: [place] CV LAB;  Service: Cardiovascular;;  . PERIPHERAL VASCULAR CATHETERIZATION Right 10/29/2015   Procedure: Lower Extremity Angiography;  Surgeon: Algernon Huxley, MD;  Location: [place] CV LAB;  Service: Cardiovascular;  Laterality: Right;  . PERIPHERAL VASCULAR CATHETERIZATION  10/29/2015   Procedure: Lower Extremity Intervention;  Surgeon: Algernon Huxley, MD;  Location: [place] CV LAB;  Service: Cardiovascular;;    History reviewed. No pertinent family history.  Social History:  reports that he has quit smoking. He has never used smokeless tobacco. He reports that he drinks about 0.6 oz of alcohol per week . He reports that he does not use drugs.  He lives in [place].  The patient is alone today.  Allergies: No Known Allergies  Current Medications: Current Outpatient Prescriptions  Medication Sig Dispense Refill  . amLODipine-benazepril (LOTREL) 5-20 MG capsule Take 5-20 capsules by mouth daily.    Marland Kitchen aspirin 81 MG tablet Take 81 mg by mouth daily.    Marland Kitchen  atorvastatin (LIPITOR) 40 MG tablet Take 40 mg by mouth daily at 6 PM.     . cilostazol (PLETAL) 100 MG tablet Take 100 mg by mouth 2 (two) times daily.    . metoprolol succinate (TOPROL-XL) 50 MG 24 hr tablet Take 1 tablet (50 mg total) by mouth every evening. 30 tablet 2  . pantoprazole (PROTONIX) 40 MG tablet Take 40 mg by mouth every evening.    . sucralfate (CARAFATE) 1 g tablet Take 1 tablet (1 g  total) by mouth 4 (four) times daily -  with meals and at bedtime. 120 tablet 0   No current facility-administered medications for this visit.     Review of Systems:  GENERAL:  Fatigue, slightly improved.  No fevers or sweats.  Weight loss of 12 pounds. PERFORMANCE STATUS (ECOG):  1 HEENT:  No visual changes, runny nose, sore throat, mouth sores or tenderness. Lungs:  Shortness of breath with exertion.  No cough.  No hemoptysis. Cardiac:  No chest pain, palpitations, orthopnea, or PND. GI:  Nausea.  No vomiting, diarrhea, constipation, melena or hematochezia. GU:  No urgency, frequency, dysuria, or hematuria. Musculoskeletal:  Pain in hips and legs.  No muscle tenderness. Extremities:  Legs swell when hematocrit is low. Skin:  No rashes or skin changes. Neuro:  No headache, numbness or weakness, balance or coordination issues. Endocrine:  No diabetes, thyroid issues, hot flashes or night sweats. Psych:  No mood changes, depression or anxiety.  Poor sleep. Pain:  No focal pain. Review of systems:  All other systems reviewed and found to be negative.  Physical Exam: Blood pressure 111/68, pulse 60, temperature (!) 96.2 F (35.7 C), temperature source Tympanic, resp. rate 18, weight 196 lb 13.9 oz (89.3 kg). GENERAL:  Well developed, well nourished, gentleman sitting comfortably in the exam room in no acute distress. MENTAL STATUS:  Alert and oriented to person, place and time. HEAD: Pearline Cables hair.  Mustache.  Normocephalic, atraumatic, face symmetric, no Cushingoid features. EYES:  Glasses.  Blue eyes.  Pupils equal round and reactive to light and accomodation.  No conjunctivitis or scleral icterus. ENT:  Oropharynx clear without lesion.  Tongue normal. Mucous membranes moist.  RESPIRATORY:  Clear to auscultation without rales, wheezes or rhonchi. CARDIOVASCULAR:  Regular rate and rhythm without murmur, rub or gallop. ABDOMEN:  Soft, non-tender, with active bowel sounds, and no appreciable  hepatosplenomegaly.  Left upper quadrant slightly full.  No masses. SKIN:  Right carotid scar.  No rashes, ulcers or lesions. EXTREMITIES: No edema, no skin discoloration or tenderness.  No palpable cords. LYMPH NODES: No palpable cervical, supraclavicular, axillary or inguinal adenopathy  NEUROLOGICAL: Unremarkable. PSYCH:  Appropriate.   Appointment on 07/31/2016  Component Date Value Ref Range Status  . WBC 07/31/2016 4.7  3.8 - 10.6 K/uL Final  . RBC 07/31/2016 4.64  4.40 - 5.90 MIL/uL Final  . Hemoglobin 07/31/2016 11.3* 13.0 - 18.0 g/dL Final  . HCT 07/31/2016 35.0* 40.0 - 52.0 % Final  . MCV 07/31/2016 75.4* 80.0 - 100.0 fL Final  . MCH 07/31/2016 24.4* 26.0 - 34.0 pg Final  . MCHC 07/31/2016 32.4  32.0 - 36.0 g/dL Final  . RDW 07/31/2016 24.3* 11.5 - 14.5 % Final  . Platelets 07/31/2016 193  150 - 440 K/uL Final  . Neutrophils Relative % 07/31/2016 48  % Final  . Neutro Abs 07/31/2016 2.3  1.4 - 6.5 K/uL Final  . Lymphocytes Relative 07/31/2016 29  % Final  . Lymphs Abs 07/31/2016  1.3  1.0 - 3.6 K/uL Final  . Monocytes Relative 07/31/2016 9  % Final  . Monocytes Absolute 07/31/2016 0.4  0.2 - 1.0 K/uL Final  . Eosinophils Relative 07/31/2016 12  % Final  . Eosinophils Absolute 07/31/2016 0.6  0 - 0.7 K/uL Final  . Basophils Relative 07/31/2016 2  % Final  . Basophils Absolute 07/31/2016 0.1  0 - 0.1 K/uL Final  . Ferritin 07/31/2016 112  24 - 336 ng/mL Final    Assessment:  Jay Dougherty is a 66 y.o. male with iron deficiency anemia secondary to gastritis.  Diet is fair.  He is intolerant of oral iron.  He was admitted to [facility] from 04/22/2016 - 04/25/2016 with symptomatic anemia.  CBC revealed a hematocrit of 25.1, hemoglobin 7.6, and MCV 63.6.  Ferritin was 24 on 04/25/2016.  Iron studies included a 7% iron saturation and a TIBC of 689 (high). B12 and folate were normal. He received 2 units of PRBCs.  Stool was guaiac positive.    EGD on 04/24/2016 revealed patchy mild  inflammation characterized by erosions, erythema and granularity in the gastric fundus.  There was grade I non-bleeding esophageal varices.  Colonoscopy on 04/25/2016 only revealed internal hemorrhoids.  PPI and carafate were instituted.   A capsule study was conducted on 07/08/2016.  No results are available.   Work-up on 07/03/2016 revealed a hematocrit 28.6, hemoglobin 9, MCV 71.8, platelets 145,000, white count 4300 with an ANC of 2300.  Ferritin was 10.  Iron saturation was 2% with a TIBC of 623 (high).  Normal studies included:  SPEP, free light chain ratio, and urinalysis.  Sedimentation rate was 40 (0-20).    He received Venofer weekly x 4 (07/04/2016 - 07/25/2016).  He has a history of hepatitis C s/p Harvoni in 10/2014.  Symptomatically, he remains fatigued.  Exam is unremarkable.  Hematocrit is 35.  Ferritin is 112.  Plan: 1.  Review labs from yesterday.  Hematocrit and MCV improved.  Iron stores are normal. 2.  No Venofer today. 3.  Follow-up with GI to ensure gastritis has improved.  4.  RTC in 1 month for MD assessment, labs (CBC with diff, CMP, ferritin, iron studies, sed rate- day before) and +/- Venofer  The patient was seen and examined.  The assessment and plan was discussed with the patient.  Labs show improvement.  Patient notes nausea.  He has lost weight.  Patient to follow-up with GI.  If no improvement, consider additional labs + abdomen/pelvic CT scans.  Multiple questions were asked and answered.    Faythe Casa, NP  Lequita Asal, MD  08/01/2016, 1:01 PM

## 2016-08-27 ENCOUNTER — Other Ambulatory Visit: Payer: Self-pay | Admitting: Hematology and Oncology

## 2016-08-27 ENCOUNTER — Inpatient Hospital Stay: Payer: 59 | Attending: Hematology and Oncology

## 2016-08-27 DIAGNOSIS — R197 Diarrhea, unspecified: Secondary | ICD-10-CM | POA: Diagnosis not present

## 2016-08-27 DIAGNOSIS — Z87891 Personal history of nicotine dependence: Secondary | ICD-10-CM | POA: Insufficient documentation

## 2016-08-27 DIAGNOSIS — I10 Essential (primary) hypertension: Secondary | ICD-10-CM | POA: Insufficient documentation

## 2016-08-27 DIAGNOSIS — I700 Atherosclerosis of aorta: Secondary | ICD-10-CM | POA: Insufficient documentation

## 2016-08-27 DIAGNOSIS — R0602 Shortness of breath: Secondary | ICD-10-CM | POA: Diagnosis not present

## 2016-08-27 DIAGNOSIS — M549 Dorsalgia, unspecified: Secondary | ICD-10-CM | POA: Insufficient documentation

## 2016-08-27 DIAGNOSIS — M47816 Spondylosis without myelopathy or radiculopathy, lumbar region: Secondary | ICD-10-CM | POA: Insufficient documentation

## 2016-08-27 DIAGNOSIS — Z7982 Long term (current) use of aspirin: Secondary | ICD-10-CM | POA: Insufficient documentation

## 2016-08-27 DIAGNOSIS — R634 Abnormal weight loss: Secondary | ICD-10-CM | POA: Diagnosis not present

## 2016-08-27 DIAGNOSIS — C220 Liver cell carcinoma: Secondary | ICD-10-CM | POA: Diagnosis not present

## 2016-08-27 DIAGNOSIS — B182 Chronic viral hepatitis C: Secondary | ICD-10-CM | POA: Insufficient documentation

## 2016-08-27 DIAGNOSIS — I739 Peripheral vascular disease, unspecified: Secondary | ICD-10-CM | POA: Insufficient documentation

## 2016-08-27 DIAGNOSIS — R630 Anorexia: Secondary | ICD-10-CM | POA: Diagnosis not present

## 2016-08-27 DIAGNOSIS — K5900 Constipation, unspecified: Secondary | ICD-10-CM | POA: Diagnosis not present

## 2016-08-27 DIAGNOSIS — K5730 Diverticulosis of large intestine without perforation or abscess without bleeding: Secondary | ICD-10-CM | POA: Insufficient documentation

## 2016-08-27 DIAGNOSIS — I2510 Atherosclerotic heart disease of native coronary artery without angina pectoris: Secondary | ICD-10-CM | POA: Insufficient documentation

## 2016-08-27 DIAGNOSIS — D509 Iron deficiency anemia, unspecified: Secondary | ICD-10-CM | POA: Diagnosis not present

## 2016-08-27 DIAGNOSIS — K769 Liver disease, unspecified: Secondary | ICD-10-CM | POA: Diagnosis not present

## 2016-08-27 DIAGNOSIS — R5383 Other fatigue: Secondary | ICD-10-CM | POA: Diagnosis not present

## 2016-08-27 DIAGNOSIS — Z79899 Other long term (current) drug therapy: Secondary | ICD-10-CM | POA: Insufficient documentation

## 2016-08-27 DIAGNOSIS — E611 Iron deficiency: Secondary | ICD-10-CM

## 2016-08-27 DIAGNOSIS — Z8719 Personal history of other diseases of the digestive system: Secondary | ICD-10-CM | POA: Insufficient documentation

## 2016-08-27 DIAGNOSIS — R110 Nausea: Secondary | ICD-10-CM | POA: Insufficient documentation

## 2016-08-27 DIAGNOSIS — G8929 Other chronic pain: Secondary | ICD-10-CM | POA: Insufficient documentation

## 2016-08-27 LAB — FERRITIN: Ferritin: 22 ng/mL — ABNORMAL LOW (ref 24–336)

## 2016-08-27 LAB — COMPREHENSIVE METABOLIC PANEL
ALT: 23 U/L (ref 17–63)
AST: 35 U/L (ref 15–41)
Albumin: 4 g/dL (ref 3.5–5.0)
Alkaline Phosphatase: 95 U/L (ref 38–126)
Anion gap: 8 (ref 5–15)
BUN: 15 mg/dL (ref 6–20)
CO2: 22 mmol/L (ref 22–32)
Calcium: 9.2 mg/dL (ref 8.9–10.3)
Chloride: 102 mmol/L (ref 101–111)
Creatinine, Ser: 1.07 mg/dL (ref 0.61–1.24)
GFR calc Af Amer: >60 mL/min (ref >60)
GFR calc non Af Amer: >60 mL/min (ref >60)
Glucose, Bld: 123 mg/dL — ABNORMAL HIGH (ref 65–99)
Potassium: 3.9 mmol/L (ref 3.5–5.1)
Sodium: 132 mmol/L — ABNORMAL LOW (ref 135–145)
Total Bilirubin: 0.6 mg/dL (ref 0.3–1.2)
Total Protein: 8.2 g/dL — ABNORMAL HIGH (ref 6.5–8.1)

## 2016-08-27 LAB — SEDIMENTATION RATE: Sed Rate: 27 mm/hr — ABNORMAL HIGH (ref 0–20)

## 2016-08-27 LAB — CBC WITH DIFFERENTIAL/PLATELET
Basophils Absolute: 0.1 10*3/uL (ref 0–0.1)
Basophils Relative: 3 %
Eosinophils Absolute: 0.3 10*3/uL (ref 0–0.7)
Eosinophils Relative: 5 %
HCT: 36.1 % — ABNORMAL LOW (ref 40.0–52.0)
Hemoglobin: 11.7 g/dL — ABNORMAL LOW (ref 13.0–18.0)
Lymphocytes Relative: 23 %
Lymphs Abs: 1.3 10*3/uL (ref 1.0–3.6)
MCH: 24.7 pg — ABNORMAL LOW (ref 26.0–34.0)
MCHC: 32.4 g/dL (ref 32.0–36.0)
MCV: 76.2 fL — ABNORMAL LOW (ref 80.0–100.0)
Monocytes Absolute: 0.5 10*3/uL (ref 0.2–1.0)
Monocytes Relative: 9 %
Neutro Abs: 3.3 10*3/uL (ref 1.4–6.5)
Neutrophils Relative %: 60 %
Platelets: 252 10*3/uL (ref 150–440)
RBC: 4.74 MIL/uL (ref 4.40–5.90)
RDW: 22 % — ABNORMAL HIGH (ref 11.5–14.5)
WBC: 5.6 10*3/uL (ref 3.8–10.6)

## 2016-08-27 LAB — IRON AND TIBC
Iron: 34 ug/dL — ABNORMAL LOW (ref 45–182)
Saturation Ratios: 6 % — ABNORMAL LOW (ref 17.9–39.5)
TIBC: 533 ug/dL — ABNORMAL HIGH (ref 250–450)
UIBC: 499 ug/dL

## 2016-08-29 ENCOUNTER — Other Ambulatory Visit: Payer: Self-pay | Admitting: Hematology and Oncology

## 2016-08-29 ENCOUNTER — Inpatient Hospital Stay: Payer: 59

## 2016-08-29 ENCOUNTER — Inpatient Hospital Stay (HOSPITAL_BASED_OUTPATIENT_CLINIC_OR_DEPARTMENT_OTHER): Payer: 59 | Admitting: Hematology and Oncology

## 2016-08-29 VITALS — BP 107/67 | HR 57 | Temp 97.6°F | Resp 18 | Wt 194.2 lb

## 2016-08-29 DIAGNOSIS — R197 Diarrhea, unspecified: Secondary | ICD-10-CM

## 2016-08-29 DIAGNOSIS — K7460 Unspecified cirrhosis of liver: Secondary | ICD-10-CM

## 2016-08-29 DIAGNOSIS — R110 Nausea: Secondary | ICD-10-CM

## 2016-08-29 DIAGNOSIS — I739 Peripheral vascular disease, unspecified: Secondary | ICD-10-CM

## 2016-08-29 DIAGNOSIS — D509 Iron deficiency anemia, unspecified: Secondary | ICD-10-CM

## 2016-08-29 DIAGNOSIS — R5383 Other fatigue: Secondary | ICD-10-CM | POA: Diagnosis not present

## 2016-08-29 DIAGNOSIS — I2510 Atherosclerotic heart disease of native coronary artery without angina pectoris: Secondary | ICD-10-CM

## 2016-08-29 DIAGNOSIS — K5900 Constipation, unspecified: Secondary | ICD-10-CM

## 2016-08-29 DIAGNOSIS — Z8719 Personal history of other diseases of the digestive system: Secondary | ICD-10-CM

## 2016-08-29 DIAGNOSIS — B182 Chronic viral hepatitis C: Secondary | ICD-10-CM

## 2016-08-29 DIAGNOSIS — Z7982 Long term (current) use of aspirin: Secondary | ICD-10-CM

## 2016-08-29 DIAGNOSIS — R630 Anorexia: Secondary | ICD-10-CM | POA: Diagnosis not present

## 2016-08-29 DIAGNOSIS — I10 Essential (primary) hypertension: Secondary | ICD-10-CM | POA: Diagnosis not present

## 2016-08-29 DIAGNOSIS — R0602 Shortness of breath: Secondary | ICD-10-CM

## 2016-08-29 DIAGNOSIS — K769 Liver disease, unspecified: Secondary | ICD-10-CM

## 2016-08-29 DIAGNOSIS — Z87891 Personal history of nicotine dependence: Secondary | ICD-10-CM

## 2016-08-29 MED ORDER — IRON SUCROSE 20 MG/ML IV SOLN
200.0000 mg | Freq: Once | INTRAVENOUS | Status: AC
  Administered 2016-08-29: 200 mg via INTRAVENOUS
  Filled 2016-08-29: qty 10

## 2016-08-29 MED ORDER — SODIUM CHLORIDE 0.9 % IV SOLN: 200.0000 mg | Freq: Once | INTRAVENOUS | Status: DC

## 2016-08-29 MED ORDER — SODIUM CHLORIDE 0.9 % IV SOLN
INTRAVENOUS | Status: DC
  Administered 2016-08-29: 11:00:00 via INTRAVENOUS
  Filled 2016-08-29: qty 1000

## 2016-08-29 NOTE — Progress Notes (Signed)
[facility] day:  08/29/2016  Chief Complaint: Jay Dougherty is a 66 y.o. male with iron deficiency anemia who is seen for 1 month assessment.  HPI:  The patient was last seen in the hematology clinic on 08/01/2016.  At that time, he remained fatigued.  Exam was unremarkable.  Hematocrit was 35.  Ferritin was 112.  He received no Venofer.  He was to follow-up with GI.  Labs on 08/27/2016 revealed a hematocrit of 36.1, hemoglobin 11.7, and MCV 76.2.  Ferritin was 22 with an iron saturation of 6% and a TIBC of 533 (high).  ESR was 27.  Patient states he will be seeing his dentist on 09/10/2016 for previously neglected dental work.  Symptomatically, he notes no appetite.  He has a change in taste sensation.  He has some nausea.  He notes some constipation and diarrhea.  He notes fatigue.  He has some shortness of breath with exertion.  When discussing his symptoms and proposing an abdomen and pelvic CT scan, he notes imaging in the past.  Abdomen and pelvic CT scan on 09/19/2014 revealed diffuse inflammation predominantly involving the descending colon and sigmoid colon with portions of the transverse colon involved. Colonic diverticula were in the sigmoid region.  There was a cirrhotic liver with hyperdense lesions measuring up to 1.8 cm.  There was pericholecystic fluid/gallbladder wall thickening and perihepatic fluid.  MRI of the abdomen with and without contrast on 06/27/2014 revealed morphologic changes in the liver compatible with mild cirrhosis. There were multiple liver lesions most compatible with regenerative nodules.   Past Medical History:  Diagnosis Date  . Chronic hepatitis C ([place])    s/p treatment with Harvoni x 12 weeks completed 10/2014, SVR maintained at 48 weeks.  . Coronary artery disease   . Hypertension   . Peripheral vascular disease [facility])     Past Surgical History:  Procedure Laterality Date  . CAROTID ENDARTERECTOMY Right    . COLONOSCOPY WITH PROPOFOL N/A 04/25/2016   Procedure: COLONOSCOPY WITH PROPOFOL;  Surgeon: Manya Silvas, MD;  Location: [facility] ENDOSCOPY;  Service: Endoscopy;  Laterality: N/A;  . ESOPHAGOGASTRODUODENOSCOPY (EGD) WITH PROPOFOL N/A 04/24/2016   Procedure: ESOPHAGOGASTRODUODENOSCOPY (EGD) WITH PROPOFOL;  Surgeon: Manya Silvas, MD;  Location: [facility] ENDOSCOPY;  Service: Endoscopy;  Laterality: N/A;  . PALATE / UVULA BIOPSY / EXCISION     removal  . PERIPHERAL VASCULAR CATHETERIZATION N/A 09/24/2015   Procedure: Abdominal Aortogram w/Lower Extremity;  Surgeon: Algernon Huxley, MD;  Location: [place] CV LAB;  Service: Cardiovascular;  Laterality: N/A;  . PERIPHERAL VASCULAR CATHETERIZATION  09/24/2015   Procedure: Lower Extremity Intervention;  Surgeon: Algernon Huxley, MD;  Location: [place] CV LAB;  Service: Cardiovascular;;  . PERIPHERAL VASCULAR CATHETERIZATION Right 10/29/2015   Procedure: Lower Extremity Angiography;  Surgeon: Algernon Huxley, MD;  Location: [place] CV LAB;  Service: Cardiovascular;  Laterality: Right;  . PERIPHERAL VASCULAR CATHETERIZATION  10/29/2015   Procedure: Lower Extremity Intervention;  Surgeon: Algernon Huxley, MD;  Location: [place] CV LAB;  Service: Cardiovascular;;    No family history on file.  Social History:  reports that he has quit smoking. He has never used smokeless tobacco. He reports that he drinks about 0.6 oz of alcohol per week . He reports that he does not use drugs.  He lives in [place].  The patient is alone today.  Allergies: No Known Allergies  Current Medications: Current Outpatient Prescriptions  Medication  Sig Dispense Refill  . amLODipine-benazepril (LOTREL) 5-20 MG capsule Take 5-20 capsules by mouth daily.    Marland Kitchen aspirin 81 MG tablet Take 81 mg by mouth daily.    Marland Kitchen atorvastatin (LIPITOR) 40 MG tablet Take 40 mg by mouth daily at 6 PM.     . cilostazol (PLETAL) 100 MG tablet Take 100 mg by mouth 2 (two) times daily.    .  metoprolol succinate (TOPROL-XL) 50 MG 24 hr tablet Take 1 tablet (50 mg total) by mouth every evening. 30 tablet 2  . pantoprazole (PROTONIX) 40 MG tablet Take 40 mg by mouth every evening.    . sucralfate (CARAFATE) 1 g tablet Take 1 tablet (1 g total) by mouth 4 (four) times daily -  with meals and at bedtime. 120 tablet 0   No current facility-administered medications for this visit.    Facility-Administered Medications Ordered in Other Visits  Medication Dose Route Frequency Provider Last Rate Last Dose  . 0.9 %  sodium chloride infusion   Intravenous Continuous Lequita Asal, MD   Stopped at 08/29/16 1200    Review of Systems:  GENERAL:  Chronically fatigued, slightly improved.  No fevers or sweats.  Weight loss of 2 pounds. PERFORMANCE STATUS (ECOG):  1 HEENT:  No visual changes, runny nose, sore throat, mouth sores or tenderness. Lungs:  Shortness of breath with exertion.  No cough.  No hemoptysis. Cardiac:  Chest discomfort and PND. No palpitations or orthopnea. GI:  Nausea.  Alternating diarrhea and constipation.  No vomiting, melena, or hematochezia. GU:  Frequency.  No urgency, dysuria, or hematuria. Musculoskeletal:  Pain in back and legs.  No muscle tenderness. Extremities:  Legs swell when hematocrit is low, not currently swollen. Skin:  No rashes or skin changes. Neuro:  Occasional headache with dizzy spells. No numbness, weakness, balance or coordination issues. Endocrine:  No diabetes, thyroid issues, hot flashes or night sweats. Psych:  "Tired of being sick".  No mood changes, depression or anxiety.  Poor sleep. Pain:  No focal pain. Review of systems:  All other systems reviewed and found to be negative.  Physical Exam: Blood pressure 107/67, pulse (!) 57, temperature 97.6 F (36.4 C), temperature source Tympanic, resp. rate 18, weight 194 lb 3.6 oz (88.1 kg). GENERAL:  Well developed, well nourished, slightly fatigued appearing gentleman sitting comfortably in  the exam room in no acute distress. MENTAL STATUS:  Alert and oriented to person, place and time. HEAD: Pearline Cables hair.  Mustache.  Normocephalic, atraumatic, face symmetric, no Cushingoid features. EYES:  Glasses.  Blue eyes.  Pupils equal round and reactive to light and accomodation.  No conjunctivitis or scleral icterus. ENT:  Oropharynx clear without lesion.  Tongue normal. Mucous membranes moist.  RESPIRATORY:  Clear to auscultation without rales, wheezes or rhonchi. CARDIOVASCULAR:  Regular rate and rhythm without murmur, rub or gallop. ABDOMEN:  Soft, non-tender, with active bowel sounds, and no appreciable hepatosplenomegaly.  No masses. SKIN:  Right carotid scar.  No rashes, ulcers or lesions. EXTREMITIES: No edema, no skin discoloration or tenderness.  No palpable cords. LYMPH NODES: No palpable cervical, supraclavicular, axillary or inguinal adenopathy  NEUROLOGICAL: Unremarkable. PSYCH:  Appropriate.   Appointment on 08/27/2016  Component Date Value Ref Range Status  . WBC 08/27/2016 5.6  3.8 - 10.6 K/uL Final  . RBC 08/27/2016 4.74  4.40 - 5.90 MIL/uL Final  . Hemoglobin 08/27/2016 11.7* 13.0 - 18.0 g/dL Final  . HCT 08/27/2016 36.1* 40.0 - 52.0 % Final  .  MCV 08/27/2016 76.2* 80.0 - 100.0 fL Final  . MCH 08/27/2016 24.7* 26.0 - 34.0 pg Final  . MCHC 08/27/2016 32.4  32.0 - 36.0 g/dL Final  . RDW 08/27/2016 22.0* 11.5 - 14.5 % Final  . Platelets 08/27/2016 252  150 - 440 K/uL Final  . Neutrophils Relative % 08/27/2016 60  % Final  . Neutro Abs 08/27/2016 3.3  1.4 - 6.5 K/uL Final  . Lymphocytes Relative 08/27/2016 23  % Final  . Lymphs Abs 08/27/2016 1.3  1.0 - 3.6 K/uL Final  . Monocytes Relative 08/27/2016 9  % Final  . Monocytes Absolute 08/27/2016 0.5  0.2 - 1.0 K/uL Final  . Eosinophils Relative 08/27/2016 5  % Final  . Eosinophils Absolute 08/27/2016 0.3  0 - 0.7 K/uL Final  . Basophils Relative 08/27/2016 3  % Final  . Basophils Absolute 08/27/2016 0.1  0 - 0.1 K/uL  Final  . Ferritin 08/27/2016 22* 24 - 336 ng/mL Final  . Iron 08/27/2016 34* 45 - 182 ug/dL Final  . TIBC 08/27/2016 533* 250 - 450 ug/dL Final  . Saturation Ratios 08/27/2016 6* 17.9 - 39.5 % Final  . UIBC 08/27/2016 499  ug/dL Final  . Sed Rate 08/27/2016 27* 0 - 20 mm/hr Final  . Sodium 08/27/2016 132* 135 - 145 mmol/L Final  . Potassium 08/27/2016 3.9  3.5 - 5.1 mmol/L Final  . Chloride 08/27/2016 102  101 - 111 mmol/L Final  . CO2 08/27/2016 22  22 - 32 mmol/L Final  . Glucose, Bld 08/27/2016 123* 65 - 99 mg/dL Final  . BUN 08/27/2016 15  6 - 20 mg/dL Final  . Creatinine, Ser 08/27/2016 1.07  0.61 - 1.24 mg/dL Final  . Calcium 08/27/2016 9.2  8.9 - 10.3 mg/dL Final  . Total Protein 08/27/2016 8.2* 6.5 - 8.1 g/dL Final  . Albumin 08/27/2016 4.0  3.5 - 5.0 g/dL Final  . AST 08/27/2016 35  15 - 41 U/L Final  . ALT 08/27/2016 23  17 - 63 U/L Final  . Alkaline Phosphatase 08/27/2016 95  38 - 126 U/L Final  . Total Bilirubin 08/27/2016 0.6  0.3 - 1.2 mg/dL Final  . GFR calc non Af Amer 08/27/2016 >60  >60 mL/min Final  . GFR calc Af Amer 08/27/2016 >60  >60 mL/min Final   Comment: (NOTE) The eGFR has been calculated using the CKD EPI equation. This calculation has not been validated in all clinical situations. eGFR's persistently <60 mL/min signify possible Chronic Kidney Disease.   . Anion gap 08/27/2016 8  5 - 15 Final    Assessment:  Wilberth Damon is a 66 y.o. male with iron deficiency anemia secondary to gastritis.  Diet is fair.  He is intolerant of oral iron.  He was admitted to [facility] from 04/22/2016 - 04/25/2016 with symptomatic anemia.  CBC revealed a hematocrit of 25.1, hemoglobin 7.6, and MCV 63.6.  Ferritin was 24 on 04/25/2016.  Iron studies included a 7% iron saturation and a TIBC of 689 (high). B12 and folate were normal. He received 2 units of PRBCs.  Stool was guaiac positive.    EGD on 04/24/2016 revealed patchy mild inflammation characterized by erosions,  erythema and granularity in the gastric fundus.  There was grade I non-bleeding esophageal varices.  Colonoscopy on 04/25/2016 only revealed internal hemorrhoids.  PPI and carafate were instituted.   A capsule study was conducted on 07/08/2016.  No results are available.   Work-up on 07/03/2016 revealed a hematocrit 28.6,  hemoglobin 9, MCV 71.8, platelets 145,000, white count 4300 with an ANC of 2300.  Ferritin was 10.  Iron saturation was 2% with a TIBC of 623 (high).  Normal studies included:  SPEP, free light chain ratio, and urinalysis.  Sedimentation rate was 40 (0-20).    He received Venofer weekly x 4 (07/04/2016 - 07/25/2016).  He receives Venofer if his ferritin < 30.  Ferritin has been followed: 24 on 04/25/2016, 10 on 01/11/10218, 112 on 07/31/2016 and 22 on 08/27/2016.  He has a history of hepatitis C s/p Harvoni in 10/2014.   MRI of the abdomen on 06/27/2014 revealed morphologic changes in the liver compatible with mild cirrhosis. There were multiple liver lesions most compatible with regenerative nodules.  Abdomen and pelvic CT on 09/19/2014 revealed a cirrhotic liver with hyperdense lesions measuring up to 1.8 cm.   Symptomatically, he remains fatigued.  He has nausea and weight loss.  Exam is unremarkable.  Hematocrit is 36.1.  Ferritin is 22 and iron studies are c/w persistent iron deficiency.  Plan: 1.  Review labs from 03/07/018. 2.  Labs today:  AFP. 3.  Discuss improvement in anemia, but persistent iron deficiency.  Discuss additional IV iron.  Discuss follow-up with GI. 4.  Discuss current unexplained symptoms.  Discuss prior imaging.  Discuss checking AFP and liver MRI.  Discuss plan for AFP check every 6 months and RUQ ultrasound. 5.  Venofer today. 6.  RTC in 1 week for Venofer. 7.  Patient to call for GI follow-up to ensure gastritis has improved.  8.  Obtain capsule study results from gastroenterology. 9.  Schedule liver MRI to assess lesions noted on last CT  scan. 10.  RTC in 1 month for MD assessment, labs (CBC with diff, ferritin - day before).  I saw and evaluated the patient, participating in the key portions of the service and reviewing pertinent diagnostic studies and records.  I reviewed the nurse practitioner's note and agree with the findings and the plan.  The assessment and plan were discussed with the patient.  Additional diagnostic studies of a liver MRI are needed to clarify the patient's symptoms and follow-up on prior imaging studies and would change the clinical management.  Multiple questions were asked by the patient and answered.    Lucendia Herrlich, NP   Lequita Asal, MD  08/29/2016, 12:39 PM

## 2016-08-29 NOTE — Progress Notes (Signed)
Patient complains of nausea and dizziness.  States he has not eaten this morning. Also states he is having problems moving his bowels.  He will have several days without a BM then he will go and then has diarrhea for several days.  Continues to feel fatigued and no energy.  Also SOB, more on exertion.

## 2016-08-30 LAB — AFP TUMOR MARKER: AFP-Tumor Marker: 7270 ng/mL — ABNORMAL HIGH (ref 0.0–8.3)

## 2016-08-31 ENCOUNTER — Encounter: Payer: Self-pay | Admitting: Hematology and Oncology

## 2016-08-31 DIAGNOSIS — K769 Liver disease, unspecified: Secondary | ICD-10-CM | POA: Insufficient documentation

## 2016-08-31 DIAGNOSIS — K7460 Unspecified cirrhosis of liver: Secondary | ICD-10-CM | POA: Insufficient documentation

## 2016-09-01 ENCOUNTER — Other Ambulatory Visit: Payer: Self-pay | Admitting: Hematology and Oncology

## 2016-09-01 DIAGNOSIS — K769 Liver disease, unspecified: Secondary | ICD-10-CM

## 2016-09-01 DIAGNOSIS — K7460 Unspecified cirrhosis of liver: Secondary | ICD-10-CM

## 2016-09-04 ENCOUNTER — Telehealth: Payer: Self-pay | Admitting: *Deleted

## 2016-09-04 ENCOUNTER — Ambulatory Visit
Admission: RE | Admit: 2016-09-04 | Discharge: 2016-09-04 | Disposition: A | Discharge status: Home / Self Care | Payer: 59 | Source: Ambulatory Visit | Attending: Hematology and Oncology | Admitting: Hematology and Oncology

## 2016-09-04 DIAGNOSIS — K769 Liver disease, unspecified: Secondary | ICD-10-CM | POA: Diagnosis present

## 2016-09-04 DIAGNOSIS — I700 Atherosclerosis of aorta: Secondary | ICD-10-CM | POA: Insufficient documentation

## 2016-09-04 DIAGNOSIS — K7460 Unspecified cirrhosis of liver: Secondary | ICD-10-CM | POA: Diagnosis not present

## 2016-09-04 DIAGNOSIS — C220 Liver cell carcinoma: Secondary | ICD-10-CM | POA: Diagnosis not present

## 2016-09-04 MED ORDER — GADOXETATE DISODIUM 0.25 MMOL/ML IV SOLN
10.0000 mL | Freq: Once | INTRAVENOUS | Status: AC | PRN
  Administered 2016-09-04: 8 mL via INTRAVENOUS

## 2016-09-04 NOTE — Telephone Encounter (Signed)
Called report of MRI done today. Printed report and given to Dr Mike Gip

## 2016-09-05 ENCOUNTER — Inpatient Hospital Stay: Payer: 59

## 2016-09-05 ENCOUNTER — Other Ambulatory Visit: Payer: Self-pay | Admitting: Hematology and Oncology

## 2016-09-05 ENCOUNTER — Inpatient Hospital Stay (HOSPITAL_BASED_OUTPATIENT_CLINIC_OR_DEPARTMENT_OTHER): Payer: 59 | Admitting: Oncology

## 2016-09-05 VITALS — BP 131/71 | HR 70 | Temp 97.9°F | Resp 18 | Wt 196.0 lb

## 2016-09-05 DIAGNOSIS — R197 Diarrhea, unspecified: Secondary | ICD-10-CM

## 2016-09-05 DIAGNOSIS — R5383 Other fatigue: Secondary | ICD-10-CM

## 2016-09-05 DIAGNOSIS — C220 Liver cell carcinoma: Secondary | ICD-10-CM

## 2016-09-05 DIAGNOSIS — I2510 Atherosclerotic heart disease of native coronary artery without angina pectoris: Secondary | ICD-10-CM

## 2016-09-05 DIAGNOSIS — M549 Dorsalgia, unspecified: Secondary | ICD-10-CM

## 2016-09-05 DIAGNOSIS — D509 Iron deficiency anemia, unspecified: Secondary | ICD-10-CM

## 2016-09-05 DIAGNOSIS — Z7982 Long term (current) use of aspirin: Secondary | ICD-10-CM

## 2016-09-05 DIAGNOSIS — R634 Abnormal weight loss: Secondary | ICD-10-CM | POA: Diagnosis not present

## 2016-09-05 DIAGNOSIS — R0602 Shortness of breath: Secondary | ICD-10-CM

## 2016-09-05 DIAGNOSIS — G8929 Other chronic pain: Secondary | ICD-10-CM

## 2016-09-05 DIAGNOSIS — R630 Anorexia: Secondary | ICD-10-CM | POA: Diagnosis not present

## 2016-09-05 DIAGNOSIS — I10 Essential (primary) hypertension: Secondary | ICD-10-CM

## 2016-09-05 DIAGNOSIS — I739 Peripheral vascular disease, unspecified: Secondary | ICD-10-CM

## 2016-09-05 DIAGNOSIS — B182 Chronic viral hepatitis C: Secondary | ICD-10-CM

## 2016-09-05 DIAGNOSIS — K5730 Diverticulosis of large intestine without perforation or abscess without bleeding: Secondary | ICD-10-CM

## 2016-09-05 DIAGNOSIS — K5900 Constipation, unspecified: Secondary | ICD-10-CM

## 2016-09-05 DIAGNOSIS — Z8719 Personal history of other diseases of the digestive system: Secondary | ICD-10-CM

## 2016-09-05 DIAGNOSIS — K769 Liver disease, unspecified: Secondary | ICD-10-CM

## 2016-09-05 DIAGNOSIS — M47816 Spondylosis without myelopathy or radiculopathy, lumbar region: Secondary | ICD-10-CM | POA: Diagnosis not present

## 2016-09-05 DIAGNOSIS — Z87891 Personal history of nicotine dependence: Secondary | ICD-10-CM

## 2016-09-05 DIAGNOSIS — I700 Atherosclerosis of aorta: Secondary | ICD-10-CM

## 2016-09-05 DIAGNOSIS — Z79899 Other long term (current) drug therapy: Secondary | ICD-10-CM

## 2016-09-05 DIAGNOSIS — R110 Nausea: Secondary | ICD-10-CM

## 2016-09-05 MED ORDER — SODIUM CHLORIDE 0.9 % IV SOLN
INTRAVENOUS | Status: DC
  Administered 2016-09-05: 15:00:00 via INTRAVENOUS
  Filled 2016-09-05: qty 1000

## 2016-09-05 MED ORDER — IRON SUCROSE 20 MG/ML IV SOLN
200.0000 mg | Freq: Once | INTRAVENOUS | Status: AC
  Administered 2016-09-05: 200 mg via INTRAVENOUS
  Filled 2016-09-05: qty 10

## 2016-09-05 MED ORDER — SODIUM CHLORIDE 0.9 % IV SOLN: 200.0000 mg | Freq: Once | INTRAVENOUS | Status: DC

## 2016-09-05 NOTE — Progress Notes (Signed)
Patient here for consultation.  Stays nauseated all the time.  Appetite decreased.  Generalized pain in hips and knees.

## 2016-09-05 NOTE — Progress Notes (Signed)
Hematology/Oncology Consult note [facility]  Telephone:([phone] [phone]  Patient Care Team: Jodi Marble, MD as PCP - General (Internal Medicine) Lavera Guise, PA-C as Physician Assistant Manya Silvas, MD (Gastroenterology)   Name of the patient: Jay Dougherty  [id number]  1950-12-19   Date of visit: 09/05/16  Diagnosis- new diagnosis of [facility]  Chief complaint/ Reason for visit- discuss MRI results  Heme/Onc history: 1. Patient is a 66 year old male who sees Dr. Mike Gip for iron deficiency anemia. In November 2017 his H&H was 7.6/25.1 and he received blood transfusions as well as IV iron.   2. EGD on 04/24/2016 by Dr. Gaylyn Cheers revealed patchy mild inflammation characterized by erosions, erythema and granularity in the gastric fundus.  There was grade I non-bleeding esophageal varices.  Colonoscopy on 04/25/2016 only revealed internal hemorrhoids.  PPI and carafate were instituted.   3. Despite getting IV iron patient continued to feel poorly and fatigued patient does have a history of hepatitis C and cirrhosis and was treated with harvoni in the past..   4. MRI of the abdomen in January 2016 showed: 1. Morphologic changes in the liver compatible with mild cirrhosis. There are multiple liver lesions which are most compatible with regenerative nodules, as detailed above. No hypervascular lesion is identified in the liver at this time to suggest underlying hepatocellular carcinoma. 2. Biliary sludge and/or small gallstones lying dependently in the gallbladder. No current findings to suggest acute cholecystitis at this time.   5. CT abdomen in March 2016 showed:  Diffuse colitis extends from the transverse colon to the sigmoid  region. Cause of this is indeterminate and may be related to  ischemia as patient has significant atherosclerotic type changes  with narrowing of the inferior mesenteric artery (inflammation    surrounding this narrowed inferior mesenteric artery).   Sigmoid diverticula. Inflammatory process does not appear centered  at this level.   Cirrhotic liver with hyperdense lesions measuring up to 1.8 cm.  These were better assessed on recent MR and will need to be followed  by MR imaging. Please see report from 06/27/2014 MR.   Pericholecystic fluid/gallbladder wall thickening. Perihepatic  fluid. Although primary cholecystitis cannot be excluded, the  abnormal appearance of the gallbladder and may reflect changes of  underlining cirrhosis.   Coronary artery calcification. Calcification abdominal aorta with  ectasia. Calcification with narrowing celiac artery, superior  mesenteric artery and renal arteries bilaterally. Calcification and  narrowing iliac arteries.   L4 bilateral pars defect.  Degenerative changes L4-5 and L5-S1.    6. Given persistent fatigue despite IV iron and history of hep C cirrhosis as well as hyperdense lesions noted in the liver in the past, AFP was ordered which came back markedly elevated at 7270  7. This was followed by an MRI of the abdomen which showed: 1. Infiltrative hepatocellular carcinoma within both the anterior segment right and medial segment left liver lobes. Concurrent tumor thrombus within the right portal vein and its branches. Imaging findings in the setting of cirrhosis/chronic HBV are diagnostic of hepatocellular carcinoma. In not already obtained, multidisciplinary (surgical, oncological and interventional radiology) consultation is recommended. References: Bruix Maury Dus; American Association for the Study of Liver Diseases. Management of hepatocellular carcinoma: an update. Hepatology 2011;53:1020-1022, and [place] for Liver Transplant Allocation: Standardization of Liver Imaging, Diagnosis, Classification, and Reporting of Hepatocellular Carcinoma1 Radiology: Volume 266: Number 2-February 2013 2. No  evidence of metastatic disease in the abdomen.  3. Aortic atherosclerosis. 4. Lateral segment left liver lobe 12 mm signal abnormality is favored to represent a dysplastic nodule. Early hepatocellular carcinoma cannot be excluded. Consider follow-up at 3 months with MRI.  Interval history- feels fatigued but denies pain or other complaints. He has chronic back pain that has been stable  ECOG PS- 1 Pain scale- 4- chronic back pain   Review of systems- Review of Systems  Constitutional: Positive for malaise/fatigue. Negative for chills, fever and weight loss.  HENT: Negative for congestion, ear discharge and nosebleeds.   Eyes: Negative for blurred vision.  Respiratory: Negative for cough, hemoptysis, sputum production, shortness of breath and wheezing.   Cardiovascular: Negative for chest pain, palpitations, orthopnea and claudication.  Gastrointestinal: Negative for abdominal pain, blood in stool, constipation, diarrhea, heartburn, melena, nausea and vomiting.  Genitourinary: Negative for dysuria, flank pain, frequency, hematuria and urgency.  Musculoskeletal: Positive for back pain. Negative for joint pain and myalgias.  Skin: Negative for rash.  Neurological: Negative for dizziness, tingling, focal weakness, seizures, weakness and headaches.  Endo/Heme/Allergies: Does not bruise/bleed easily.  Psychiatric/Behavioral: Negative for depression and suicidal ideas. The patient does not have insomnia.     No Known Allergies   Past Medical History:  Diagnosis Date  . Chronic hepatitis C ([place])    s/p treatment with Harvoni x 12 weeks completed 10/2014, SVR maintained at 48 weeks.  . Coronary artery disease   . Hypertension   . Peripheral vascular disease [facility])      Past Surgical History:  Procedure Laterality Date  . CAROTID ENDARTERECTOMY Right   . COLONOSCOPY WITH PROPOFOL N/A 04/25/2016   Procedure: COLONOSCOPY WITH PROPOFOL;  Surgeon: Manya Silvas, MD;  Location: [facility]  ENDOSCOPY;  Service: Endoscopy;  Laterality: N/A;  . ESOPHAGOGASTRODUODENOSCOPY (EGD) WITH PROPOFOL N/A 04/24/2016   Procedure: ESOPHAGOGASTRODUODENOSCOPY (EGD) WITH PROPOFOL;  Surgeon: Manya Silvas, MD;  Location: [facility] ENDOSCOPY;  Service: Endoscopy;  Laterality: N/A;  . PALATE / UVULA BIOPSY / EXCISION     removal  . PERIPHERAL VASCULAR CATHETERIZATION N/A 09/24/2015   Procedure: Abdominal Aortogram w/Lower Extremity;  Surgeon: Algernon Huxley, MD;  Location: [place] CV LAB;  Service: Cardiovascular;  Laterality: N/A;  . PERIPHERAL VASCULAR CATHETERIZATION  09/24/2015   Procedure: Lower Extremity Intervention;  Surgeon: Algernon Huxley, MD;  Location: [place] CV LAB;  Service: Cardiovascular;;  . PERIPHERAL VASCULAR CATHETERIZATION Right 10/29/2015   Procedure: Lower Extremity Angiography;  Surgeon: Algernon Huxley, MD;  Location: [place] CV LAB;  Service: Cardiovascular;  Laterality: Right;  . PERIPHERAL VASCULAR CATHETERIZATION  10/29/2015   Procedure: Lower Extremity Intervention;  Surgeon: Algernon Huxley, MD;  Location: [place] CV LAB;  Service: Cardiovascular;;    Social History   Social History  . Marital status: Married    Spouse name: N/A  . Number of children: N/A  . Years of education: N/A   Occupational History  . Not on file.   Social History Main Topics  . Smoking status: Former Research scientist (life sciences)  . Smokeless tobacco: Never Used  . Alcohol use 0.6 oz/week    1 Cans of beer per week     Comment: monday  . Drug use: No  . Sexual activity: Not on file   Other Topics Concern  . Not on file   Social History Narrative  . No narrative on file    No family history on file.   Current Outpatient Prescriptions:  .  amLODipine-benazepril (LOTREL) 5-20 MG capsule, Take 5-20  capsules by mouth daily., Disp: , Rfl:  .  aspirin 81 MG tablet, Take 81 mg by mouth daily., Disp: , Rfl:  .  atorvastatin (LIPITOR) 40 MG tablet, Take 40 mg by mouth daily at 6 PM. , Disp: , Rfl:  .   cilostazol (PLETAL) 100 MG tablet, Take 100 mg by mouth 2 (two) times daily., Disp: , Rfl:  .  metoprolol succinate (TOPROL-XL) 50 MG 24 hr tablet, Take 1 tablet (50 mg total) by mouth every evening., Disp: 30 tablet, Rfl: 2 .  pantoprazole (PROTONIX) 40 MG tablet, Take 40 mg by mouth every evening., Disp: , Rfl:  .  sucralfate (CARAFATE) 1 g tablet, Take 1 tablet (1 g total) by mouth 4 (four) times daily -  with meals and at bedtime., Disp: 120 tablet, Rfl: 0  Physical exam:  Vitals:   09/05/16 1346  BP: 131/71  Pulse: 70  Resp: 18  Temp: 97.9 F (36.6 C)  TempSrc: Tympanic  Weight: 195 lb 15.8 oz (88.9 kg)   Physical Exam  Constitutional: He is oriented to person, place, and time and well-developed, well-nourished, and in no distress.  HENT:  Head: Normocephalic and atraumatic.  Eyes: EOM are normal. Pupils are equal, round, and reactive to light.  Neck: Normal range of motion.  Cardiovascular: Normal rate, regular rhythm and normal heart sounds.   Pulmonary/Chest: Effort normal and breath sounds normal.  Abdominal: Soft. Bowel sounds are normal.  No ascites or palpable organomegaly  Neurological: He is alert and oriented to person, place, and time.  Skin: Skin is warm and dry.     CMP Latest Ref Rng & Units 08/27/2016  Glucose 65 - 99 mg/dL 123(H)  BUN 6 - 20 mg/dL 15  Creatinine 0.61 - 1.24 mg/dL 1.07  Sodium 135 - 145 mmol/L 132(L)  Potassium 3.5 - 5.1 mmol/L 3.9  Chloride 101 - 111 mmol/L 102  CO2 22 - 32 mmol/L 22  Calcium 8.9 - 10.3 mg/dL 9.2  Total Protein 6.5 - 8.1 g/dL 8.2(H)  Total Bilirubin 0.3 - 1.2 mg/dL 0.6  Alkaline Phos 38 - 126 U/L 95  AST 15 - 41 U/L 35  ALT 17 - 63 U/L 23   CBC Latest Ref Rng & Units 08/27/2016  WBC 3.8 - 10.6 K/uL 5.6  Hemoglobin 13.0 - 18.0 g/dL 11.7(L)  Hematocrit 40.0 - 52.0 % 36.1(L)  Platelets 150 - 440 K/uL 252    No images are attached to the encounter.  Mr Abdomen W Wo Contrast  Result Date: 09/04/2016 CLINICAL DATA:   Hepatitis-C. Weight loss. Low energy for 6 months. Follow-up liver lesion. Cirrhosis. EXAM: MRI ABDOMEN WITHOUT AND WITH CONTRAST TECHNIQUE: Multiplanar multisequence MR imaging of the abdomen was performed both before and after the administration of intravenous contrast. CONTRAST:  8 cc MultiHance COMPARISON:  09/19/2014 CT.  06/27/2014 MRI. FINDINGS: Lower chest: Normal heart size without pericardial or pleural effusion. Hepatobiliary: Moderate cirrhosis. Development of infiltrative hepatocellular carcinoma. The most masslike area is within the hepatic dome, straddling the anterior segment right and medial segment left lobes. This demonstrates T2 hyperintensity, heterogeneous arterial phase hyper enhancement and portal venous phase hypoenhancement. Portal venous "Pseudo capsule" . Measures 6.1 x 6.4 cm on image 23/ series 7. Tumor tracks within the anterior liver with a contiguous masslike area measuring 3.8 x 3.9 cm in segment 4B on image 37/series 7. Concurrent tumor extension into right portal vein, primarily its anterior segment. Example image 33/series 9. Signal abnormality of tumor within branches  of the right portal vein to the anterior segment also identified more superiorly. Lateral segment left liver lobe focus of precontrast T1 hyperintensity measures 12 mm on image 47/series 4. Suspect mild arterial phase hyper enhancement in this area on image 48/series 5. No portal venous phase hypoenhancement identified. Equivocal restricted diffusion in this area on image 48/series 15. Normal gallbladder, without biliary ductal dilatation. Pancreas:  Normal, without mass or ductal dilatation. Spleen:  Normal in size, without focal abnormality. Adrenals/Urinary Tract: Normal adrenal glands. Bilateral too small to characterize renal lesions. No hydronephrosis. Stomach/Bowel: Normal stomach and abdominal bowel loops. Vascular/Lymphatic: Aortic and branch vessel atherosclerosis. Presumed ulcerative plaque within the  aorta on image 44/series 7. Hepatic veins patent. Patent splenic vein and superior mesenteric vein. No specific evidence of portal venous hypertension. No retroperitoneal or retrocrural adenopathy. Other:  No ascites.  No evidence of omental or peritoneal disease. Musculoskeletal: No acute osseous abnormality. IMPRESSION: 1. Infiltrative hepatocellular carcinoma within both the anterior segment right and medial segment left liver lobes. Concurrent tumor thrombus within the right portal vein and its branches. Imaging findings in the setting of cirrhosis/chronic HBV are diagnostic of hepatocellular carcinoma. In not already obtained, multidisciplinary (surgical, oncological and interventional radiology) consultation is recommended. References: Bruix Maury Dus; American Association for the Study of Liver Diseases. Management of hepatocellular carcinoma: an update. Hepatology 2011;53:1020-1022, and [place] for Liver Transplant Allocation: Standardization of Liver Imaging, Diagnosis, Classification, and Reporting of Hepatocellular Carcinoma1 Radiology: Volume 266: Number 2-February 2013 2. No evidence of metastatic disease in the abdomen. 3. Aortic atherosclerosis. 4. Lateral segment left liver lobe 12 mm signal abnormality is favored to represent a dysplastic nodule. Early hepatocellular carcinoma cannot be excluded. Consider follow-up at 3 months with MRI. These results will be called to the ordering clinician or representative by the Radiologist Assistant, and communication documented in the PACS or zVision Dashboard. Electronically Signed   By: Abigail Miyamoto M.D.   On: 09/04/2016 13:35     Assessment and plan- Patient is a 66 y.o. male who sees ud for following issues  1. Hepatocellular carcinoma- Atleast Stage IIIB cT4N0 Mx. MRI findings show Hepatocellular carcinoma and elevated AFP is also suggestive of the same. He does not need biopsy confirmation at this time. We will get CT chest with  contrast and bone scan to complete staging work up. He will be referred to [facility] to see if he would be a surgical or transplant candidate but he has evidence of tumor thrombus that may preclude surgery. Patient will see Dr. Mike Gip in 2 weeks time after he is seen at [facility] and to discuss CT and bone scan results. He does not have decompensated cirrhosis and he is a Child Pugh A. I have discussed all this with the patient in detail.  2. Iron deficiency anemia- last dose of venofer today. Repeat cbc and iron studies in 4 weeks  Patient was tearful during this visit today and I reassured him that we will make arrangements for tsts and follow ups in a timely manner to expedite his management   Visit Diagnosis 1. Hepatocellular carcinoma ([place])   2. Iron deficiency anemia, unspecified iron deficiency anemia type      Dr. Randa Evens, MD, MPH [place] at [facility] Pager- [phone] 09/05/2016 1:45 PM

## 2016-09-10 ENCOUNTER — Ambulatory Visit: Payer: 59

## 2016-09-10 ENCOUNTER — Ambulatory Visit
Admission: RE | Admit: 2016-09-10 | Discharge: 2016-09-10 | Disposition: A | Discharge status: Home / Self Care | Payer: 59 | Source: Ambulatory Visit | Attending: Oncology | Admitting: Oncology

## 2016-09-10 DIAGNOSIS — I700 Atherosclerosis of aorta: Secondary | ICD-10-CM | POA: Diagnosis not present

## 2016-09-10 DIAGNOSIS — C220 Liver cell carcinoma: Secondary | ICD-10-CM | POA: Diagnosis present

## 2016-09-10 DIAGNOSIS — I2510 Atherosclerotic heart disease of native coronary artery without angina pectoris: Secondary | ICD-10-CM | POA: Insufficient documentation

## 2016-09-10 HISTORY — DX: Malignant (primary) neoplasm, unspecified: C80.1

## 2016-09-10 MED ORDER — IOPAMIDOL (ISOVUE-300) INJECTION 61%
75.0000 mL | Freq: Once | INTRAVENOUS | Status: AC | PRN
  Administered 2016-09-10: 75 mL via INTRAVENOUS

## 2016-09-11 ENCOUNTER — Inpatient Hospital Stay (HOSPITAL_BASED_OUTPATIENT_CLINIC_OR_DEPARTMENT_OTHER): Payer: 59 | Admitting: Hematology and Oncology

## 2016-09-11 ENCOUNTER — Ambulatory Visit: Payer: 59

## 2016-09-11 VITALS — BP 145/78 | HR 64 | Temp 96.0°F | Resp 18 | Wt 193.4 lb

## 2016-09-11 DIAGNOSIS — K769 Liver disease, unspecified: Secondary | ICD-10-CM

## 2016-09-11 DIAGNOSIS — R110 Nausea: Secondary | ICD-10-CM

## 2016-09-11 DIAGNOSIS — Z79899 Other long term (current) drug therapy: Secondary | ICD-10-CM

## 2016-09-11 DIAGNOSIS — K5730 Diverticulosis of large intestine without perforation or abscess without bleeding: Secondary | ICD-10-CM | POA: Diagnosis not present

## 2016-09-11 DIAGNOSIS — I10 Essential (primary) hypertension: Secondary | ICD-10-CM

## 2016-09-11 DIAGNOSIS — R0602 Shortness of breath: Secondary | ICD-10-CM

## 2016-09-11 DIAGNOSIS — M549 Dorsalgia, unspecified: Secondary | ICD-10-CM | POA: Diagnosis not present

## 2016-09-11 DIAGNOSIS — R634 Abnormal weight loss: Secondary | ICD-10-CM | POA: Diagnosis not present

## 2016-09-11 DIAGNOSIS — I739 Peripheral vascular disease, unspecified: Secondary | ICD-10-CM

## 2016-09-11 DIAGNOSIS — M47816 Spondylosis without myelopathy or radiculopathy, lumbar region: Secondary | ICD-10-CM

## 2016-09-11 DIAGNOSIS — C220 Liver cell carcinoma: Secondary | ICD-10-CM

## 2016-09-11 DIAGNOSIS — D509 Iron deficiency anemia, unspecified: Secondary | ICD-10-CM

## 2016-09-11 DIAGNOSIS — I2510 Atherosclerotic heart disease of native coronary artery without angina pectoris: Secondary | ICD-10-CM

## 2016-09-11 DIAGNOSIS — R630 Anorexia: Secondary | ICD-10-CM

## 2016-09-11 DIAGNOSIS — R5383 Other fatigue: Secondary | ICD-10-CM | POA: Diagnosis not present

## 2016-09-11 DIAGNOSIS — G8929 Other chronic pain: Secondary | ICD-10-CM | POA: Diagnosis not present

## 2016-09-11 DIAGNOSIS — Z7982 Long term (current) use of aspirin: Secondary | ICD-10-CM

## 2016-09-11 DIAGNOSIS — R197 Diarrhea, unspecified: Secondary | ICD-10-CM

## 2016-09-11 DIAGNOSIS — Z8719 Personal history of other diseases of the digestive system: Secondary | ICD-10-CM

## 2016-09-11 DIAGNOSIS — B182 Chronic viral hepatitis C: Secondary | ICD-10-CM

## 2016-09-11 DIAGNOSIS — I700 Atherosclerosis of aorta: Secondary | ICD-10-CM | POA: Diagnosis not present

## 2016-09-11 DIAGNOSIS — Z87891 Personal history of nicotine dependence: Secondary | ICD-10-CM

## 2016-09-11 DIAGNOSIS — K5900 Constipation, unspecified: Secondary | ICD-10-CM

## 2016-09-11 NOTE — Progress Notes (Signed)
  Oncology Nurse Navigator Documentation CD of MRI from 09/07/16 sent overnight fed ex to Dr. Mariah Milling at [facility] Location: [place] (09/11/16 0900)   )Navigator Encounter Type: Diagnostic Results;Letter/Fax/Email (09/11/16 0900)                                                    Time Spent with Patient: 15 (09/11/16 0900)

## 2016-09-11 NOTE — Progress Notes (Signed)
[place] [facility] [facility] day:  09/11/2016  Chief Complaint: Jay Dougherty is a 66 y.o. male with clinical stage IIIB (T4N0M0) hepatocellular carcinoma and iron deficiency anemia who is seen for review of interval CT scans and discussion regarding direction of therapy.   HPI:  The patient was last seen by me in the hematology clinic on 08/29/2016.  At that time, he remained fatigued.  He had nausea and weight loss.  Hematocrit is 36.1.  Ferritin was 22.  He received additional IV iron.  We discussed labs (AFP) and imaging studies based on his symptoms and prior history of hepatitis C and CT scans.  AFP on 08/29/2016 was 7,270.  Liver MRI on 09/04/2016 revealed Infiltrative hepatocellular carcinoma within both the anterior segment right and medial segment left liver lobes.   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  There was a 12 mm signal abnormality in the lateral segment left liver lobe, favored to represent a dysplastic nodule. Early hepatocellular carcinoma could not be excluded.  I spoke to the patient on the phone regarding these finds.  He was referred to [facility] for surgical opinion.  He met with Dr. Janese Banks in my absence on 09/05/2016.  Chest CT on 09/10/2016 revealed no evidence of metastatic disease.  Symptomatically, he feels the same. He notes chronic fatigue. He denies any pain.   Past Medical History:  Diagnosis Date  . Cancer ([facility])   . Chronic hepatitis C ([place])    s/p treatment with Harvoni x 12 weeks completed 10/2014, SVR maintained at 48 weeks.  . Coronary artery disease   . Hypertension   . Peripheral vascular disease [facility])     Past Surgical History:  Procedure Laterality Date  . CAROTID ENDARTERECTOMY Right   . COLONOSCOPY WITH PROPOFOL N/A 04/25/2016   Procedure: COLONOSCOPY WITH PROPOFOL;  Surgeon: Manya Silvas, MD;  Location: [facility] ENDOSCOPY;  Service: Endoscopy;  Laterality: N/A;  .  ESOPHAGOGASTRODUODENOSCOPY (EGD) WITH PROPOFOL N/A 04/24/2016   Procedure: ESOPHAGOGASTRODUODENOSCOPY (EGD) WITH PROPOFOL;  Surgeon: Manya Silvas, MD;  Location: [facility] ENDOSCOPY;  Service: Endoscopy;  Laterality: N/A;  . PALATE / UVULA BIOPSY / EXCISION     removal  . PERIPHERAL VASCULAR CATHETERIZATION N/A 09/24/2015   Procedure: Abdominal Aortogram w/Lower Extremity;  Surgeon: Algernon Huxley, MD;  Location: [place] CV LAB;  Service: Cardiovascular;  Laterality: N/A;  . PERIPHERAL VASCULAR CATHETERIZATION  09/24/2015   Procedure: Lower Extremity Intervention;  Surgeon: Algernon Huxley, MD;  Location: [place] CV LAB;  Service: Cardiovascular;;  . PERIPHERAL VASCULAR CATHETERIZATION Right 10/29/2015   Procedure: Lower Extremity Angiography;  Surgeon: Algernon Huxley, MD;  Location: [place] [facility] LAB;  Service: Cardiovascular;  Laterality: Right;  . PERIPHERAL VASCULAR CATHETERIZATION  10/29/2015   Procedure: Lower Extremity Intervention;  Surgeon: Algernon Huxley, MD;  Location: [place] CV LAB;  Service: Cardiovascular;;    History reviewed. No pertinent family history.  Social History:  reports that he has quit smoking. He has never used smokeless tobacco. He reports that he drinks about 0.6 oz of alcohol per week . He reports that he does not use drugs.  He lives in [place].  The patient is accompanied by his wife today.  Allergies: No Known Allergies  Current Medications: Current Outpatient Prescriptions  Medication Sig Dispense Refill  . amLODipine-benazepril (LOTREL) 5-20 MG capsule Take 5-20 capsules by mouth daily.    Marland Kitchen aspirin 81 MG tablet  Take 81 mg by mouth daily.    Marland Kitchen atorvastatin (LIPITOR) 40 MG tablet Take 40 mg by mouth daily at 6 PM.     . cilostazol (PLETAL) 100 MG tablet Take 100 mg by mouth 2 (two) times daily.    . metoprolol succinate (TOPROL-XL) 50 MG 24 hr tablet Take 1 tablet (50 mg total) by mouth every evening. 30 tablet 2  . pantoprazole (PROTONIX) 40 MG tablet  Take 40 mg by mouth every evening.    . sucralfate (CARAFATE) 1 g tablet Take 1 tablet (1 g total) by mouth 4 (four) times daily -  with meals and at bedtime. (Patient not taking: Reported on 10/07/2016) 120 tablet 0  . clopidogrel (PLAVIX) 75 MG tablet Take 75 mg by mouth daily.    . ondansetron (ZOFRAN) 8 MG tablet Take 8 mg by mouth every 8 (eight) hours as needed.    Marland Kitchen oxyCODONE (OXY IR/ROXICODONE) 5 MG immediate release tablet Take 5 mg by mouth every 4 (four) hours as needed.     No current facility-administered medications for this visit.     Review of Systems:  GENERAL:  Chronically fatigued.  No fevers or sweats.  Weight down 1 pound. PERFORMANCE STATUS (ECOG):  1 HEENT:  No visual changes, runny nose, sore throat, mouth sores or tenderness. Lungs:  Shortness of breath with exertion.  No cough.  No hemoptysis. Cardiac:  No chest pain, palpitations, orthopnea or PND. GI:  Nausea.  Alternating diarrhea and constipation.  No vomiting, melena, or hematochezia. GU:  Frequency.  No urgency, dysuria, or hematuria. Musculoskeletal:  Pain in back and legs.  No muscle tenderness. Extremities:  Legs swell when hematocrit is low, not currently swollen. Skin:  No rashes or skin changes. Neuro:  No headache, focal numbness, weakness, balance or coordination issues. Endocrine:  No diabetes, thyroid issues, hot flashes or night sweats. Psych:  Upset about diagnosis.  No mood changes, depression or anxiety.  Poor sleep. Pain:  No focal pain. Review of systems:  All other systems reviewed and found to be negative.  Physical Exam: Blood pressure (!) 145/78, pulse 64, temperature (!) 96 F (35.6 C), temperature source Tympanic, resp. rate 18, weight 193 lb 6 oz (87.7 kg). GENERAL:  Well developed, well nourished, slightly fatigued appearing gentleman sitting comfortably in the exam room in no acute distress. MENTAL STATUS:  Alert and oriented to person, place and time. HEAD: Pearline Cables hair.  Mustache.   Normocephalic, atraumatic, face symmetric, no Cushingoid features. EYES:  Glasses.  Blue eyes.  No conjunctivitis or scleral icterus. NEUROLOGICAL: Unremarkable. PSYCH:  Appropriate.   No visits with results within 3 Day(s) from this visit.  Latest known visit with results is:  Office Visit on 08/29/2016  Component Date Value Ref Range Status  . AFP-Tumor Marker 08/29/2016 7270.0* 0.0 - 8.3 ng/mL Final   Comment: (NOTE) Results confirmed on dilution. Roche ECLIA methodology Performed At: [facility] [address] [place], [place] [id number] Lindon Romp MD AL:[id number]     Assessment:  Jay Dougherty is a 66 y.o. male with clinical stage IIB (T4N0M0) hepatocellular carcinoma and iron deficiency anemia   He has a history of hepatitis C s/p Harvoni in 10/2014.   MRI of the abdomen on 06/27/2014 revealed morphologic changes in the liver compatible with mild cirrhosis. There were multiple liver lesions most compatible with regenerative nodules.  Abdomen and pelvic CT on 09/19/2014 revealed a cirrhotic liver with hyperdense lesions measuring up to 1.8 cm.  Liver MRI on 09/04/2016 revealed infiltrative hepatocellular carcinoma within both the anterior segment right and medial segment left liver lobes.   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  There was a 12 mm signal abnormality in the lateral segment left liver lobe, favored to represent a dysplastic nodule. Early hepatocellular carcinoma could not be excluded.  Chest CT on 09/10/2016 revealed no evidence of metastatic disease.  AFP on 08/29/2016 was 7,270.  He has a history of iron deficiency anemia secondary to gastritis.  He was admitted to [facility] from 04/22/2016 - 04/25/2016 with symptomatic anemia.  CBC revealed a hematocrit of 25.1, hemoglobin 7.6, and MCV 63.6.  Ferritin was 24 on 04/25/2016.  Iron studies included a 7% iron saturation and a TIBC of 689 (high). B12 and folate  were normal. He received 2 units of PRBCs.  Stool was guaiac positive.    EGD on 04/24/2016 revealed patchy mild inflammation characterized by erosions, erythema and granularity in the gastric fundus.  There was grade I non-bleeding esophageal varices.  Colonoscopy on 04/25/2016 only revealed internal hemorrhoids.  PPI and carafate were instituted.   A capsule study was conducted on 07/08/2016.  No results are available.   Work-up on 07/03/2016 revealed a hematocrit 28.6, hemoglobin 9, MCV 71.8, platelets 145,000, white count 4300 with an ANC of 2300.  Ferritin was 10.  Iron saturation was 2% with a TIBC of 623 (high).  Normal studies included:  SPEP, free light chain ratio, and urinalysis.  Sedimentation rate was 40 (0-20).    Diet is fair.  He is intolerant of oral iron.  He received Venofer weekly x 4 (07/04/2016 - 07/25/2016) and x 2 (08/29/2016 - 09/05/2016).  He receives Venofer if his ferritin < 30.  Ferritin has been followed: 24 on 04/25/2016, 10 on 01/11/10218, 112 on 07/31/2016 and 22 on 08/27/2016.  Symptomatically, he remains fatigued.  He has nausea and weight loss.  Exam is unremarkable.  Hematocrit is 36.1.  Ferritin is 22 and iron studies are c/w persistent iron deficiency.  Plan: 1.  Review labs and scans.  Discuss diagnosis, staging, and management of hepatocellular carcinoma.  Unclear if patient surgically resectable.  Discuss referral to [facility] for surgical opinion.  Discuss other treatment options including localized therapy (stereotactic radiotherapy, Y-90) or systemic therapy (sorafenib).  Doubt patient is a transplant candidate secondary to extensive disease.  Await multi-disciplinary review at [facility]. 2.  Patient to call for follow-up visit after evaluation at [facility].   Lequita Asal, MD  09/11/2016

## 2016-09-11 NOTE — Progress Notes (Signed)
Patient here today for CT results.  Patient accompanied by his wife today.

## 2016-09-15 ENCOUNTER — Telehealth: Payer: Self-pay | Admitting: *Deleted

## 2016-09-15 NOTE — Telephone Encounter (Signed)
Notified Mr. Nelis that everything was refaxed to Dr. Mariah Milling at [facility] as I could not locate referral in [facility]. Confirmation fax received. They notified me last week that they received CD images of MRI.

## 2016-09-15 NOTE — Telephone Encounter (Signed)
Called to report that he has not heard anything from [facility] surgery and asking if we have heard from them. Asking he get a return call to discuss this [phone]

## 2016-09-16 ENCOUNTER — Telehealth: Payer: Self-pay

## 2016-09-16 NOTE — Telephone Encounter (Signed)
  Oncology Nurse Navigator Documentation Jay Dougherty has an appointment with Dr. Mariah Milling at [facility] for surgical consult. Navigator Location: [facility] (09/16/16 1500)   )                                                     Time Spent with Patient: 15 (09/16/16 1500)

## 2016-09-29 ENCOUNTER — Inpatient Hospital Stay: Payer: 59 | Attending: Hematology and Oncology

## 2016-09-29 DIAGNOSIS — K769 Liver disease, unspecified: Secondary | ICD-10-CM

## 2016-09-29 DIAGNOSIS — B182 Chronic viral hepatitis C: Secondary | ICD-10-CM | POA: Diagnosis not present

## 2016-09-29 DIAGNOSIS — I2510 Atherosclerotic heart disease of native coronary artery without angina pectoris: Secondary | ICD-10-CM | POA: Diagnosis not present

## 2016-09-29 DIAGNOSIS — Z7982 Long term (current) use of aspirin: Secondary | ICD-10-CM | POA: Diagnosis not present

## 2016-09-29 DIAGNOSIS — Z87891 Personal history of nicotine dependence: Secondary | ICD-10-CM | POA: Diagnosis not present

## 2016-09-29 DIAGNOSIS — D509 Iron deficiency anemia, unspecified: Secondary | ICD-10-CM | POA: Diagnosis not present

## 2016-09-29 DIAGNOSIS — K648 Other hemorrhoids: Secondary | ICD-10-CM | POA: Diagnosis not present

## 2016-09-29 DIAGNOSIS — I10 Essential (primary) hypertension: Secondary | ICD-10-CM | POA: Diagnosis not present

## 2016-09-29 DIAGNOSIS — K5900 Constipation, unspecified: Secondary | ICD-10-CM | POA: Insufficient documentation

## 2016-09-29 DIAGNOSIS — I8500 Esophageal varices without bleeding: Secondary | ICD-10-CM | POA: Diagnosis not present

## 2016-09-29 DIAGNOSIS — C220 Liver cell carcinoma: Secondary | ICD-10-CM | POA: Insufficient documentation

## 2016-09-29 DIAGNOSIS — Z79899 Other long term (current) drug therapy: Secondary | ICD-10-CM | POA: Diagnosis not present

## 2016-09-29 DIAGNOSIS — R109 Unspecified abdominal pain: Secondary | ICD-10-CM | POA: Insufficient documentation

## 2016-09-29 DIAGNOSIS — R0602 Shortness of breath: Secondary | ICD-10-CM | POA: Insufficient documentation

## 2016-09-29 DIAGNOSIS — I739 Peripheral vascular disease, unspecified: Secondary | ICD-10-CM | POA: Diagnosis not present

## 2016-09-29 DIAGNOSIS — R110 Nausea: Secondary | ICD-10-CM | POA: Insufficient documentation

## 2016-09-29 LAB — CBC WITH DIFFERENTIAL/PLATELET
Basophils Absolute: 0.1 10*3/uL (ref 0–0.1)
Basophils Relative: 2 %
Eosinophils Absolute: 0.3 10*3/uL (ref 0–0.7)
Eosinophils Relative: 7 %
HCT: 40.7 % (ref 40.0–52.0)
Hemoglobin: 13.5 g/dL (ref 13.0–18.0)
Lymphocytes Relative: 25 %
Lymphs Abs: 1 10*3/uL (ref 1.0–3.6)
MCH: 26.1 pg (ref 26.0–34.0)
MCHC: 33.2 g/dL (ref 32.0–36.0)
MCV: 78.6 fL — ABNORMAL LOW (ref 80.0–100.0)
Monocytes Absolute: 0.4 10*3/uL (ref 0.2–1.0)
Monocytes Relative: 9 %
Neutro Abs: 2.3 10*3/uL (ref 1.4–6.5)
Neutrophils Relative %: 57 %
Platelets: 213 10*3/uL (ref 150–440)
RBC: 5.18 MIL/uL (ref 4.40–5.90)
RDW: 21.3 % — ABNORMAL HIGH (ref 11.5–14.5)
WBC: 4.1 10*3/uL (ref 3.8–10.6)

## 2016-09-29 LAB — FERRITIN: Ferritin: 41 ng/mL (ref 24–336)

## 2016-09-30 ENCOUNTER — Other Ambulatory Visit: Payer: Self-pay | Admitting: Hematology and Oncology

## 2016-09-30 ENCOUNTER — Inpatient Hospital Stay (HOSPITAL_BASED_OUTPATIENT_CLINIC_OR_DEPARTMENT_OTHER): Payer: 59 | Admitting: Hematology and Oncology

## 2016-09-30 VITALS — BP 120/70 | HR 65 | Temp 96.8°F | Resp 18 | Wt 187.2 lb

## 2016-09-30 DIAGNOSIS — D509 Iron deficiency anemia, unspecified: Secondary | ICD-10-CM

## 2016-09-30 DIAGNOSIS — I8500 Esophageal varices without bleeding: Secondary | ICD-10-CM

## 2016-09-30 DIAGNOSIS — R110 Nausea: Secondary | ICD-10-CM | POA: Diagnosis not present

## 2016-09-30 DIAGNOSIS — I10 Essential (primary) hypertension: Secondary | ICD-10-CM | POA: Diagnosis not present

## 2016-09-30 DIAGNOSIS — Z79899 Other long term (current) drug therapy: Secondary | ICD-10-CM

## 2016-09-30 DIAGNOSIS — R0602 Shortness of breath: Secondary | ICD-10-CM | POA: Diagnosis not present

## 2016-09-30 DIAGNOSIS — B182 Chronic viral hepatitis C: Secondary | ICD-10-CM

## 2016-09-30 DIAGNOSIS — Z7189 Other specified counseling: Secondary | ICD-10-CM | POA: Insufficient documentation

## 2016-09-30 DIAGNOSIS — K648 Other hemorrhoids: Secondary | ICD-10-CM

## 2016-09-30 DIAGNOSIS — I739 Peripheral vascular disease, unspecified: Secondary | ICD-10-CM

## 2016-09-30 DIAGNOSIS — I2510 Atherosclerotic heart disease of native coronary artery without angina pectoris: Secondary | ICD-10-CM | POA: Diagnosis not present

## 2016-09-30 DIAGNOSIS — Z87891 Personal history of nicotine dependence: Secondary | ICD-10-CM | POA: Diagnosis not present

## 2016-09-30 DIAGNOSIS — Z7982 Long term (current) use of aspirin: Secondary | ICD-10-CM

## 2016-09-30 DIAGNOSIS — C220 Liver cell carcinoma: Secondary | ICD-10-CM | POA: Diagnosis not present

## 2016-09-30 DIAGNOSIS — R109 Unspecified abdominal pain: Secondary | ICD-10-CM

## 2016-09-30 NOTE — Progress Notes (Signed)
Patient states he is having pain in abdomen.  Also has exertional SOB.  Nauseated all the time.

## 2016-09-30 NOTE — Patient Instructions (Signed)
Sorafenib Oral Tablet  What is this medicine?  SORAFENIB (soe RAF e nib) is a medicine that targets proteins in cancer cells and stops the cancer cells from growing. It is used to treat liver cancer, kidney cancer, and thyroid cancer.  This medicine may be used for other purposes; ask your health care provider or pharmacist if you have questions.  COMMON BRAND NAME(S): Nexavar  What should I tell my health care provider before I take this medicine?  They need to know if you have any of these conditions:  -bleeding problems  -heart disease  -high blood pressure  -kidney disease  -liver disease  -lung cancer  -recent surgery  -an unusual or allergic reaction to sorafenib, other medicines, foods, dyes, or preservatives  -pregnant or trying to get pregnant  -breast-feeding  How should I use this medicine?  Take this medicine by mouth with a glass of water. Follow the directions on the prescription label. Do not cut, crush or chew this medicine. Take this medicine on an empty stomach, at least 1 hour before or 2 hours after meals. Do not take with food. Take your medicine at regular intervals. Do not take it more often than directed. Do not stop taking except on your doctor's advice.  Talk to your pediatrician regarding the use of this medicine in children. Special care may be needed.  Overdosage: If you think you have taken too much of this medicine contact a poison control center or emergency room at once.  NOTE: This medicine is only for you. Do not share this medicine with others.  What if I miss a dose?  If you miss a dose, take it as soon as you can. If it is almost time for your next dose, take only that dose. Do not take double or extra doses.  What may interact with this medicine?  This medicine may interact with the following medications:  -carbamazepine  -dexamethasone  -medicines for seizures like carbamazepine, phenobarbital, phenytoin  -neomycin  -rifabutin  -rifampin  -St. John's Wort  -warfarin   This list may not describe all possible interactions. Give your health care provider a list of all the medicines, herbs, non-prescription drugs, or dietary supplements you use. Also tell them if you smoke, drink alcohol, or use illegal drugs. Some items may interact with your medicine.  What should I watch for while using this medicine?  This drug may make you feel generally unwell. This is not uncommon, as chemotherapy can affect healthy cells as well as cancer cells. Report any side effects. Continue your course of treatment even though you feel ill unless your doctor tells you to stop.  Men and women should use effective birth control while taking this medicine and for 2 weeks after stopping this medicine. Do not become pregnant while taking this medicine. Women should inform their doctor if they wish to become pregnant or think they might be pregnant. There is a potential for serious side effects to an unborn child. Talk to your health care professional or pharmacist for more information. Do not breast-feed an infant while taking this medicine.  If you are going to have surgery or any other procedures, tell your doctor you are taking this medicine.  What side effects may I notice from receiving this medicine?  Side effects that you should report to your doctor or health care professional as soon as possible:  -allergic reactions like skin rash, itching or hives, swelling of the face, lips, or tongue  -black,   tarry stools  -breathing problems  -chest pain or chest tightness  -dark urine  -dizziness  -fast or irregular heartbeat  -feeling faint or lightheaded  -high fever  -light-colored stools  -nausea, vomiting  -redness, blistering, peeling or loosening of the skin, including inside the mouth  -right upper belly pain  -sores on the hands or feet  -spitting up blood or brown material that looks like coffee grounds  -stomach pain  -yellowing of the eyes or skin   Side effects that usually do not require medical attention (report to your doctor or health care professional if they continue or are bothersome):  -diarrhea  -hair loss  -loss of appetite  -tiredness  -weight loss  This list may not describe all possible side effects. Call your doctor for medical advice about side effects. You may report side effects to FDA at 1-800-FDA-1088.  Where should I keep my medicine?  Keep out of the reach of children.  Store at room temperature between 15 and 30 degrees C (59 and 86 degrees F). Protect from moisture. Throw away any unused medicine after the expiration date.  NOTE: This sheet is a summary. It may not cover all possible information. If you have questions about this medicine, talk to your doctor, pharmacist, or health care provider.  © 2018 [employer] (2015-07-15 21:34:08)

## 2016-09-30 NOTE — Progress Notes (Signed)
[facility] day:  09/30/2016  Chief Complaint: Jay Dougherty is a 66 y.o. male with clinical stage IIIB (T4N0M0) hepatocellular carcinoma who is seen for assessment after interval [facility] evaluation.  HPI:  The patient was last seen by me in the hematology clinic on 09/11/2016.  At that time, we discussed his imaging studies.  He was felt to have stage IIIB hepatocellular carcinoma.  Concern was raised regarding the size and extent of disease.  There was also evidence of right portal vein involvement.  He was referred to [facility] to assess resectability.  He was seen by Dr. Cristino Martes on 09/23/2016.  He was felt not to be a surgical candidate as he would require an extended right hepatectomy and given his history of cirrhosis, would unlikely have enough functional liver remaining.  He was also felt deconditioned and unclear if he would be able to tolerate a major liver resection.  Options of local therapy such as radiation and chemotherapy were discussed.   CT abdomen with contrast on 09/23/2016 revealed infiltrative arterial enhancing mass in the right hepatic lobe involving segments 4, 5, 6 and 8. There were multiple satellite lesions. The largest mass measured 10.3 x 7 x 10.4 cm.  There was invasion of the right portal vein, anterior and posterior branches.  Symptomatically, he has felt "not so good".  He is not a candidate for surgery or liver transplant.  He notes nausea.  He has upper abdominal pain.  He has shortness of breath.   Past Medical History:  Diagnosis Date  . Cancer ([place])   . Chronic hepatitis C ([place])    s/p treatment with Harvoni x 12 weeks completed 10/2014, SVR maintained at 48 weeks.  . Coronary artery disease   . Hypertension   . Peripheral vascular disease [facility])     Past Surgical History:  Procedure Laterality Date  . CAROTID ENDARTERECTOMY Right   . COLONOSCOPY WITH PROPOFOL N/A 04/25/2016   Procedure: COLONOSCOPY WITH  PROPOFOL;  Surgeon: Manya Silvas, MD;  Location: [facility] ENDOSCOPY;  Service: Endoscopy;  Laterality: N/A;  . ESOPHAGOGASTRODUODENOSCOPY (EGD) WITH PROPOFOL N/A 04/24/2016   Procedure: ESOPHAGOGASTRODUODENOSCOPY (EGD) WITH PROPOFOL;  Surgeon: Manya Silvas, MD;  Location: [facility] ENDOSCOPY;  Service: Endoscopy;  Laterality: N/A;  . PALATE / UVULA BIOPSY / EXCISION     removal  . PERIPHERAL VASCULAR CATHETERIZATION N/A 09/24/2015   Procedure: Abdominal Aortogram w/Lower Extremity;  Surgeon: Algernon Huxley, MD;  Location: [place] CV LAB;  Service: Cardiovascular;  Laterality: N/A;  . PERIPHERAL VASCULAR CATHETERIZATION  09/24/2015   Procedure: Lower Extremity Intervention;  Surgeon: Algernon Huxley, MD;  Location: [place] CV LAB;  Service: Cardiovascular;;  . PERIPHERAL VASCULAR CATHETERIZATION Right 10/29/2015   Procedure: Lower Extremity Angiography;  Surgeon: Algernon Huxley, MD;  Location: [place] CV LAB;  Service: Cardiovascular;  Laterality: Right;  . PERIPHERAL VASCULAR CATHETERIZATION  10/29/2015   Procedure: Lower Extremity Intervention;  Surgeon: Algernon Huxley, MD;  Location: [place] CV LAB;  Service: Cardiovascular;;    No family history on file.  Social History:  reports that he has quit smoking. He has never used smokeless tobacco. He reports that he drinks about 0.6 oz of alcohol per week . He reports that he does not use drugs.  He lives in [place].  The patient is alone today.  Allergies: No Known Allergies  Current Medications: Current Outpatient Prescriptions  Medication Sig Dispense Refill  . amLODipine-benazepril (  LOTREL) 5-20 MG capsule Take 5-20 capsules by mouth daily.    Marland Kitchen aspirin 81 MG tablet Take 81 mg by mouth daily.    Marland Kitchen atorvastatin (LIPITOR) 40 MG tablet Take 40 mg by mouth daily at 6 PM.     . cilostazol (PLETAL) 100 MG tablet Take 100 mg by mouth 2 (two) times daily.    . clopidogrel (PLAVIX) 75 MG tablet Take 75 mg by mouth daily.    . metoprolol  succinate (TOPROL-XL) 50 MG 24 hr tablet Take 1 tablet (50 mg total) by mouth every evening. 30 tablet 2  . pantoprazole (PROTONIX) 40 MG tablet Take 40 mg by mouth every evening.    . sucralfate (CARAFATE) 1 g tablet Take 1 tablet (1 g total) by mouth 4 (four) times daily -  with meals and at bedtime. 120 tablet 0   No current facility-administered medications for this visit.     Review of Systems:  GENERAL:  Fatigue.  Feels "not so good".  No fevers or sweats.  Weight down 6 pounds. PERFORMANCE STATUS (ECOG):  1 HEENT:  No visual changes, runny nose, sore throat, mouth sores or tenderness. Lungs:  Shortness of breath with exertion.  No cough.  No hemoptysis. Cardiac:  No chest pain, palpitations, orthopnea or PND. GI:   Chronic nausea.  Alternating diarrhea and constipation.  No vomiting, melena, or hematochezia. GU:  No urgency, frequency, dysuria, or hematuria. Musculoskeletal:  Pain in back and legs.  No muscle tenderness. Extremities:  No pain or swelling.. Skin:  No rashes or skin changes. Neuro:  No headache, numbness, weakness, balance or coordination issues. Endocrine:  No diabetes, thyroid issues, hot flashes or night sweats. Psych:  No mood changes, depression or anxiety.  Poor sleep. Pain: Upper abdominal pain. Review of systems:  All other systems reviewed and found to be negative.  Physical Exam: Blood pressure 120/70, pulse 65, temperature (!) 96.8 F (36 C), temperature source Tympanic, resp. rate 18, weight 187 lb 4 oz (84.9 kg). GENERAL:  Well developed, well nourished, slightly fatigued appearing gentleman sitting comfortably in the exam room in no acute distress. MENTAL STATUS:  Alert and oriented to person, place and time. HEAD: Pearline Cables hair.  Mustache.  Normocephalic, atraumatic, face symmetric, no Cushingoid features. EYES:  Glasses.  Blue eyes.  No conjunctivitis or scleral icterus. ENT:  Oropharynx clear without lesion.  Tongue normal. Mucous membranes moist.   RESPIRATORY:  Clear to auscultation without rales, wheezes or rhonchi. CARDIOVASCULAR:  Regular rate and rhythm without murmur, rub or gallop. ABDOMEN:  Soft, non-tender, with active bowel sounds, and no appreciable hepatosplenomegaly.  No masses. SKIN:  Right carotid scar.  No rashes, ulcers or lesions. EXTREMITIES: No edema, no skin discoloration or tenderness.  No palpable cords. LYMPH NODES: No palpable cervical, supraclavicular, axillary or inguinal adenopathy  NEUROLOGICAL: Unremarkable. PSYCH:  Appropriate.   Appointment on 09/29/2016  Component Date Value Ref Range Status  . WBC 09/29/2016 4.1  3.8 - 10.6 K/uL Final  . RBC 09/29/2016 5.18  4.40 - 5.90 MIL/uL Final  . Hemoglobin 09/29/2016 13.5  13.0 - 18.0 g/dL Final  . HCT 09/29/2016 40.7  40.0 - 52.0 % Final  . MCV 09/29/2016 78.6* 80.0 - 100.0 fL Final  . MCH 09/29/2016 26.1  26.0 - 34.0 pg Final  . MCHC 09/29/2016 33.2  32.0 - 36.0 g/dL Final  . RDW 09/29/2016 21.3* 11.5 - 14.5 % Final  . Platelets 09/29/2016 213  150 - 440 K/uL Final  .  Neutrophils Relative % 09/29/2016 57  % Final  . Neutro Abs 09/29/2016 2.3  1.4 - 6.5 K/uL Final  . Lymphocytes Relative 09/29/2016 25  % Final  . Lymphs Abs 09/29/2016 1.0  1.0 - 3.6 K/uL Final  . Monocytes Relative 09/29/2016 9  % Final  . Monocytes Absolute 09/29/2016 0.4  0.2 - 1.0 K/uL Final  . Eosinophils Relative 09/29/2016 7  % Final  . Eosinophils Absolute 09/29/2016 0.3  0 - 0.7 K/uL Final  . Basophils Relative 09/29/2016 2  % Final  . Basophils Absolute 09/29/2016 0.1  0 - 0.1 K/uL Final  . Ferritin 09/29/2016 41  24 - 336 ng/mL Final    Assessment:  Jay Dougherty is a 66 y.o. male with stage IIIB hepatocellular carcinoma and iron deficiency anemia   He has a history of hepatitis C s/p Harvoni in 10/2014.   MRI of the abdomen on 06/27/2014 revealed morphologic changes in the liver compatible with mild cirrhosis. There were multiple liver lesions most compatible with  regenerative nodules.  Abdomen and pelvic CT on 09/19/2014 revealed a cirrhotic liver with hyperdense lesions measuring up to 1.8 cm.   Liver MRI on 09/04/2016 revealed infiltrative hepatocellular carcinoma within both the anterior segment right and medial segment left liver lobes.   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  There was a 12 mm signal abnormality in the lateral segment left liver lobe, favored to represent a dysplastic nodule. Early hepatocellular carcinoma could not be excluded.  AFP on 08/29/2016 was 7,270.  He has been evaluated at [facility] and is not a surgical candidate.  He has a history of iron deficiency anemia.  He was admitted to [facility] from 04/22/2016 - 04/25/2016 with symptomatic anemia.  CBC revealed a hematocrit of 25.1, hemoglobin 7.6, and MCV 63.6.  Ferritin was 24 on 04/25/2016.  Iron studies included a 7% iron saturation and a TIBC of 689 (high). B12 and folate were normal. He received 2 units of PRBCs.  Stool was guaiac positive.    EGD on 04/24/2016 revealed patchy mild inflammation characterized by erosions, erythema and granularity in the gastric fundus.  There was grade I non-bleeding esophageal varices.  Colonoscopy on 04/25/2016 only revealed internal hemorrhoids.  PPI and carafate were instituted.   A capsule study was conducted on 07/08/2016.  No results are available.   Work-up on 07/03/2016 revealed a hematocrit 28.6, hemoglobin 9, MCV 71.8, platelets 145,000, white count 4300 with an ANC of 2300.  Ferritin was 10.  Iron saturation was 2% with a TIBC of 623 (high).  Normal studies included:  SPEP, free light chain ratio, and urinalysis.  Sedimentation rate was 40 (0-20).    Diet is fair.  He is intolerant of oral iron.  He received Venofer weekly x 4 (07/04/2016 - 07/25/2016) and x 2 (08/29/2016 - 09/05/2016).  He receives Venofer if his ferritin < 30.  Ferritin has been followed: 24 on 04/25/2016, 10 on 01/11/10218, 112 on  07/31/2016 and 22 on 08/27/2016.  Symptomatically, he has upper abdominal discomfort.  He has nausea and ongoing weight loss.  Plan: 1.  Review [facility] evaluation.  Discuss recommendation for radiation or chemotherapy. 2.  Discuss evaluation by interventional radiology for Yttrium-90.  Discuss sorafenib.  Side effects reviewed.  Information provided. 3.  Preauth sorafenib (if patient is not a candidate for local therapy). 4.  RTC in 1 week for MD assessment.   Lequita Asal, MD  09/30/2016, 9:01 AM

## 2016-10-07 ENCOUNTER — Encounter: Payer: Self-pay | Admitting: Hematology and Oncology

## 2016-10-07 ENCOUNTER — Inpatient Hospital Stay (HOSPITAL_BASED_OUTPATIENT_CLINIC_OR_DEPARTMENT_OTHER): Payer: 59 | Admitting: Hematology and Oncology

## 2016-10-07 VITALS — BP 104/63 | HR 69 | Temp 95.2°F | Resp 18 | Wt 184.2 lb

## 2016-10-07 DIAGNOSIS — R109 Unspecified abdominal pain: Secondary | ICD-10-CM

## 2016-10-07 DIAGNOSIS — Z79899 Other long term (current) drug therapy: Secondary | ICD-10-CM

## 2016-10-07 DIAGNOSIS — D509 Iron deficiency anemia, unspecified: Secondary | ICD-10-CM | POA: Diagnosis not present

## 2016-10-07 DIAGNOSIS — C220 Liver cell carcinoma: Secondary | ICD-10-CM

## 2016-10-07 DIAGNOSIS — R110 Nausea: Secondary | ICD-10-CM | POA: Diagnosis not present

## 2016-10-07 DIAGNOSIS — I739 Peripheral vascular disease, unspecified: Secondary | ICD-10-CM

## 2016-10-07 DIAGNOSIS — R0602 Shortness of breath: Secondary | ICD-10-CM

## 2016-10-07 DIAGNOSIS — K5900 Constipation, unspecified: Secondary | ICD-10-CM

## 2016-10-07 DIAGNOSIS — B182 Chronic viral hepatitis C: Secondary | ICD-10-CM | POA: Diagnosis not present

## 2016-10-07 DIAGNOSIS — Z87891 Personal history of nicotine dependence: Secondary | ICD-10-CM

## 2016-10-07 DIAGNOSIS — I2510 Atherosclerotic heart disease of native coronary artery without angina pectoris: Secondary | ICD-10-CM

## 2016-10-07 DIAGNOSIS — I10 Essential (primary) hypertension: Secondary | ICD-10-CM

## 2016-10-07 DIAGNOSIS — K648 Other hemorrhoids: Secondary | ICD-10-CM | POA: Diagnosis not present

## 2016-10-07 DIAGNOSIS — Z7982 Long term (current) use of aspirin: Secondary | ICD-10-CM

## 2016-10-07 DIAGNOSIS — I8500 Esophageal varices without bleeding: Secondary | ICD-10-CM

## 2016-10-07 NOTE — Progress Notes (Signed)
[facility] day:  10/07/2016  Chief Complaint: Jay Dougherty is a 66 y.o. male with clinical stage IIIB (T4N0M0) hepatocellular carcinoma who is seen for 1 week assessment.   HPI:  The patient was last seen in the medical oncology clinic on 09/30/2016.  At that time, he was seen after initial [facility] Evaluation.  He was not a surgical candidate.  He was not a transplant candidate.  He was being considered for local intervention.  CT abdomen with contrast on 09/23/2016 revealed infiltrative arterial enhancing mass in the right hepatic lobe involving segments 4, 5, 6 and 8. There were multiple satellite lesions. The largest mass measured 10.3 x 7 x 10.4 cm.  There was invasion of the right portal vein, anterior and posterior branches.  He was seen by Dr. Shaune Leeks, radiation oncologist at [facility], on 10/01/2016.  He is being considered for stereotactic body radiotherapy (SBRT) or hypofractionated image guided radiotherapy (HIGRT).  Treatment is felt definitive.  It is anticipated that he will receive 30 Gy in 5 fractions given the large size of the mass.  He undergoes [employer].  Symptomatically, he has chronic nausea. Ondansetron is helping. He has shortness of breath on exertion. He notes no appetite. He is taking a stool softener for chronic constipation.  He is taking oxycodone for right upper quadrant pain associated with the mass   Past Medical History:  Diagnosis Date  . Cancer ([place])   . Chronic hepatitis C ([place])    s/p treatment with Harvoni x 12 weeks completed 10/2014, SVR maintained at 48 weeks.  . Coronary artery disease   . Hypertension   . Peripheral vascular disease [facility])     Past Surgical History:  Procedure Laterality Date  . CAROTID ENDARTERECTOMY Right   . COLONOSCOPY WITH PROPOFOL N/A 04/25/2016   Procedure: COLONOSCOPY WITH PROPOFOL;  Surgeon: Manya Silvas, MD;  Location: [facility] ENDOSCOPY;  Service: Endoscopy;   Laterality: N/A;  . ESOPHAGOGASTRODUODENOSCOPY (EGD) WITH PROPOFOL N/A 04/24/2016   Procedure: ESOPHAGOGASTRODUODENOSCOPY (EGD) WITH PROPOFOL;  Surgeon: Manya Silvas, MD;  Location: [facility] ENDOSCOPY;  Service: Endoscopy;  Laterality: N/A;  . PALATE / UVULA BIOPSY / EXCISION     removal  . PERIPHERAL VASCULAR CATHETERIZATION N/A 09/24/2015   Procedure: Abdominal Aortogram w/Lower Extremity;  Surgeon: Algernon Huxley, MD;  Location: [place] CV LAB;  Service: Cardiovascular;  Laterality: N/A;  . PERIPHERAL VASCULAR CATHETERIZATION  09/24/2015   Procedure: Lower Extremity Intervention;  Surgeon: Algernon Huxley, MD;  Location: [place] CV LAB;  Service: Cardiovascular;;  . PERIPHERAL VASCULAR CATHETERIZATION Right 10/29/2015   Procedure: Lower Extremity Angiography;  Surgeon: Algernon Huxley, MD;  Location: [place] CV LAB;  Service: Cardiovascular;  Laterality: Right;  . PERIPHERAL VASCULAR CATHETERIZATION  10/29/2015   Procedure: Lower Extremity Intervention;  Surgeon: Algernon Huxley, MD;  Location: [place] CV LAB;  Service: Cardiovascular;;    History reviewed. No pertinent family history.  Social History:  reports that he has quit smoking. He has never used smokeless tobacco. He reports that he drinks about 0.6 oz of alcohol per week . He reports that he does not use drugs.  He lives in [place].  The patient is alone today.  Allergies: No Known Allergies  Current Medications: Current Outpatient Prescriptions  Medication Sig Dispense Refill  . amLODipine-benazepril (LOTREL) 5-20 MG capsule Take 5-20 capsules by mouth daily.    Marland Kitchen aspirin 81 MG tablet Take 81 mg  by mouth daily.    Marland Kitchen atorvastatin (LIPITOR) 40 MG tablet Take 40 mg by mouth daily at 6 PM.     . cilostazol (PLETAL) 100 MG tablet Take 100 mg by mouth 2 (two) times daily.    . metoprolol succinate (TOPROL-XL) 50 MG 24 hr tablet Take 1 tablet (50 mg total) by mouth every evening. 30 tablet 2  . ondansetron (ZOFRAN) 8 MG tablet  Take 8 mg by mouth every 8 (eight) hours as needed.    Marland Kitchen oxyCODONE (OXY IR/ROXICODONE) 5 MG immediate release tablet Take 5 mg by mouth every 4 (four) hours as needed.    . pantoprazole (PROTONIX) 40 MG tablet Take 40 mg by mouth every evening.    . clopidogrel (PLAVIX) 75 MG tablet Take 75 mg by mouth daily.    . sucralfate (CARAFATE) 1 g tablet Take 1 tablet (1 g total) by mouth 4 (four) times daily -  with meals and at bedtime. (Patient not taking: Reported on 10/07/2016) 120 tablet 0   No current facility-administered medications for this visit.     Review of Systems:  GENERAL:  Fatigue.  No fevers or sweats.  Weight down 3  pounds. PERFORMANCE STATUS (ECOG):  1 HEENT:  No visual changes, runny nose, sore throat, mouth sores or tenderness. Lungs:  Shortness of breath with exertion.  No cough.  No hemoptysis. Cardiac:  No chest pain, palpitations, orthopnea or PND. GI:   No appetite.  Chronic nausea.  Ondansetron helping.  Constipation.  No vomiting, diarrhea, melena, or hematochezia. GU:  Frequency.  No urgency, dysuria, or hematuria. Musculoskeletal:  No focal bone pain.  No muscle tenderness. Extremities:  No pain or swelling. Skin:  No rashes or skin changes. Neuro:  No headache, numbness, weakness, balance or coordination issues. Endocrine:  No diabetes, thyroid issues, hot flashes or night sweats. Psych:  No mood changes, depression or anxiety.  Poor sleep. Pain:  RUQ pain (5 out of 10). Review of systems:  All other systems reviewed and found to be negative.  Physical Exam: Blood pressure 104/63, pulse 69, temperature (!) 95.2 F (35.1 C), temperature source Tympanic, resp. rate 18, weight 184 lb 3 oz (83.5 kg). GENERAL:  Well developed, well nourished, slightly fatigued appearing gentleman sitting comfortably in the exam room in no acute distress.  He has a cane at his side. MENTAL STATUS:  Alert and oriented to person, place and time. HEAD: Pearline Cables hair.  Mustache.   Normocephalic, atraumatic, face symmetric, no Cushingoid features. EYES:  Glasses.  Blue eyes.  No conjunctivitis or scleral icterus. NEUROLOGICAL: Unremarkable. PSYCH:  Appropriate.   No visits with results within 3 Day(s) from this visit.  Latest known visit with results is:  Appointment on 09/29/2016  Component Date Value Ref Range Status  . WBC 09/29/2016 4.1  3.8 - 10.6 K/uL Final  . RBC 09/29/2016 5.18  4.40 - 5.90 MIL/uL Final  . Hemoglobin 09/29/2016 13.5  13.0 - 18.0 g/dL Final  . HCT 09/29/2016 40.7  40.0 - 52.0 % Final  . MCV 09/29/2016 78.6* 80.0 - 100.0 fL Final  . MCH 09/29/2016 26.1  26.0 - 34.0 pg Final  . MCHC 09/29/2016 33.2  32.0 - 36.0 g/dL Final  . RDW 09/29/2016 21.3* 11.5 - 14.5 % Final  . Platelets 09/29/2016 213  150 - 440 K/uL Final  . Neutrophils Relative % 09/29/2016 57  % Final  . Neutro Abs 09/29/2016 2.3  1.4 - 6.5 K/uL Final  . Lymphocytes  Relative 09/29/2016 25  % Final  . Lymphs Abs 09/29/2016 1.0  1.0 - 3.6 K/uL Final  . Monocytes Relative 09/29/2016 9  % Final  . Monocytes Absolute 09/29/2016 0.4  0.2 - 1.0 K/uL Final  . Eosinophils Relative 09/29/2016 7  % Final  . Eosinophils Absolute 09/29/2016 0.3  0 - 0.7 K/uL Final  . Basophils Relative 09/29/2016 2  % Final  . Basophils Absolute 09/29/2016 0.1  0 - 0.1 K/uL Final  . Ferritin 09/29/2016 41  24 - 336 ng/mL Final    Assessment:  Junie Engram is a 66 y.o. male with stage IIIB hepatocellular carcinoma and iron deficiency anemia   He has a history of hepatitis C s/p Harvoni in 10/2014.   MRI of the abdomen on 06/27/2014 revealed morphologic changes in the liver compatible with mild cirrhosis. There were multiple liver lesions most compatible with regenerative nodules.  Abdomen and pelvic CT on 09/19/2014 revealed a cirrhotic liver with hyperdense lesions measuring up to 1.8 cm.   Liver MRI on 09/04/2016 revealed infiltrative hepatocellular carcinoma within both the anterior segment right  and medial segment left liver lobes.   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  There was a 12 mm signal abnormality in the lateral segment left liver lobe, favored to represent a dysplastic nodule. Early hepatocellular carcinoma could not be excluded.  Chest CT on 09/10/2016 revealed no evidence of metastatic disease.  AFPon 08/29/2016 was 7,270.  CT abdomen with contrast at [facility] on 09/23/2016 revealed infiltrative arterial enhancing mass in the right hepatic lobe involving segments 4, 5, 6 and 8. There were multiple satellite lesions. The largest mass measured 10.3 x 7 x 10.4 cm.  There was invasion of the right portal vein, anterior and posterior branches.  He has been evaluated at [facility] and is not a surgical candidate.  He is being considered for stereotactic body radiotherapy (SBRT) or hypofractionated image guided radiotherapy (HIGRT).  He has a history of iron deficiency anemia.  He was admitted to [facility] from 04/22/2016 - 04/25/2016 with symptomatic anemia.  CBC revealed a hematocrit of 25.1, hemoglobin 7.6, and MCV 63.6.  Ferritin was 24 on 04/25/2016.  Iron studies included a 7% iron saturation and a TIBC of 689 (high). B12 and folate were normal. He received 2 units of PRBCs.  Stool was guaiac positive.    EGD on 04/24/2016 revealed patchy mild inflammation characterized by erosions, erythema and granularity in the gastric fundus.  There was grade I non-bleeding esophageal varices.  Colonoscopy on 04/25/2016 only revealed internal hemorrhoids.  PPI and carafate were instituted.   A capsule study was conducted on 07/08/2016.  No results are available.   Work-up on 07/03/2016 revealed a hematocrit 28.6, hemoglobin 9, MCV 71.8, platelets 145,000, white count 4300 with an ANC of 2300.  Ferritin was 10.  Iron saturation was 2% with a TIBC of 623 (high).  Normal studies included:  SPEP, free light chain ratio, and urinalysis.  Sedimentation rate was 40 (0-20).     Diet is fair.  He is intolerant of oral iron.  He received Venofer weekly x 4 (07/04/2016 - 07/25/2016) and x 2 (08/29/2016 - 09/05/2016).  He receives Venofer if his ferritin < 30.  Ferritin has been followed: 24 on 04/25/2016, 10 on 01/11/10218, 112 on 07/31/2016 and 22 on 08/27/2016.  Symptomatically, he has chronic nausea managed with ondansetron.  He has RUQ pain treated with oxycodone.   Plan: 1.  Review  [facility] radiation oncology evaulation.  Discuss planned treatment.  Treatment is definitive.  No plans for sorafenib unless disease progresses after local therapy. 2.  Discuss plans for follow-up after recovery from SBRT or HIGRT.  Patient to contact clinic for appointment.   Lequita Asal, MD  10/07/2016, 12:43 PM

## 2016-10-07 NOTE — Progress Notes (Signed)
Patient states he is nauseated all the time. Zofran is helping.  He has SOB with any exertion.  States he has no appetite.  No BM in past 4-5 days.  States he is taking stool softener.  Having pain in right upper quadrant.

## 2016-10-27 ENCOUNTER — Encounter: Payer: Self-pay | Admitting: Hematology and Oncology

## 2017-03-09 ENCOUNTER — Other Ambulatory Visit (INDEPENDENT_AMBULATORY_CARE_PROVIDER_SITE_OTHER): Payer: Self-pay | Admitting: Vascular Surgery

## 2017-03-09 DIAGNOSIS — I739 Peripheral vascular disease, unspecified: Principal | ICD-10-CM

## 2017-03-09 DIAGNOSIS — I779 Disorder of arteries and arterioles, unspecified: Secondary | ICD-10-CM

## 2017-03-09 DIAGNOSIS — K559 Vascular disorder of intestine, unspecified: Secondary | ICD-10-CM

## 2017-03-10 ENCOUNTER — Encounter (INDEPENDENT_AMBULATORY_CARE_PROVIDER_SITE_OTHER): Payer: Self-pay

## 2017-03-10 ENCOUNTER — Ambulatory Visit (INDEPENDENT_AMBULATORY_CARE_PROVIDER_SITE_OTHER): Payer: 59 | Admitting: Vascular Surgery

## 2017-07-24 DEATH — deceased

## 2017-11-02 IMAGING — CR DG CHEST 2V
1 series · 2 of 2 positions shown · non-contrast
Comparison: May 10, 2014

CLINICAL DATA: Two week history of chest pain and wheezing

EXAM:
CHEST  2 VIEW

[Series 1: dg chest 2 view · 0.14mm/px · 2 of 2 slices shown]
[im 1/2]
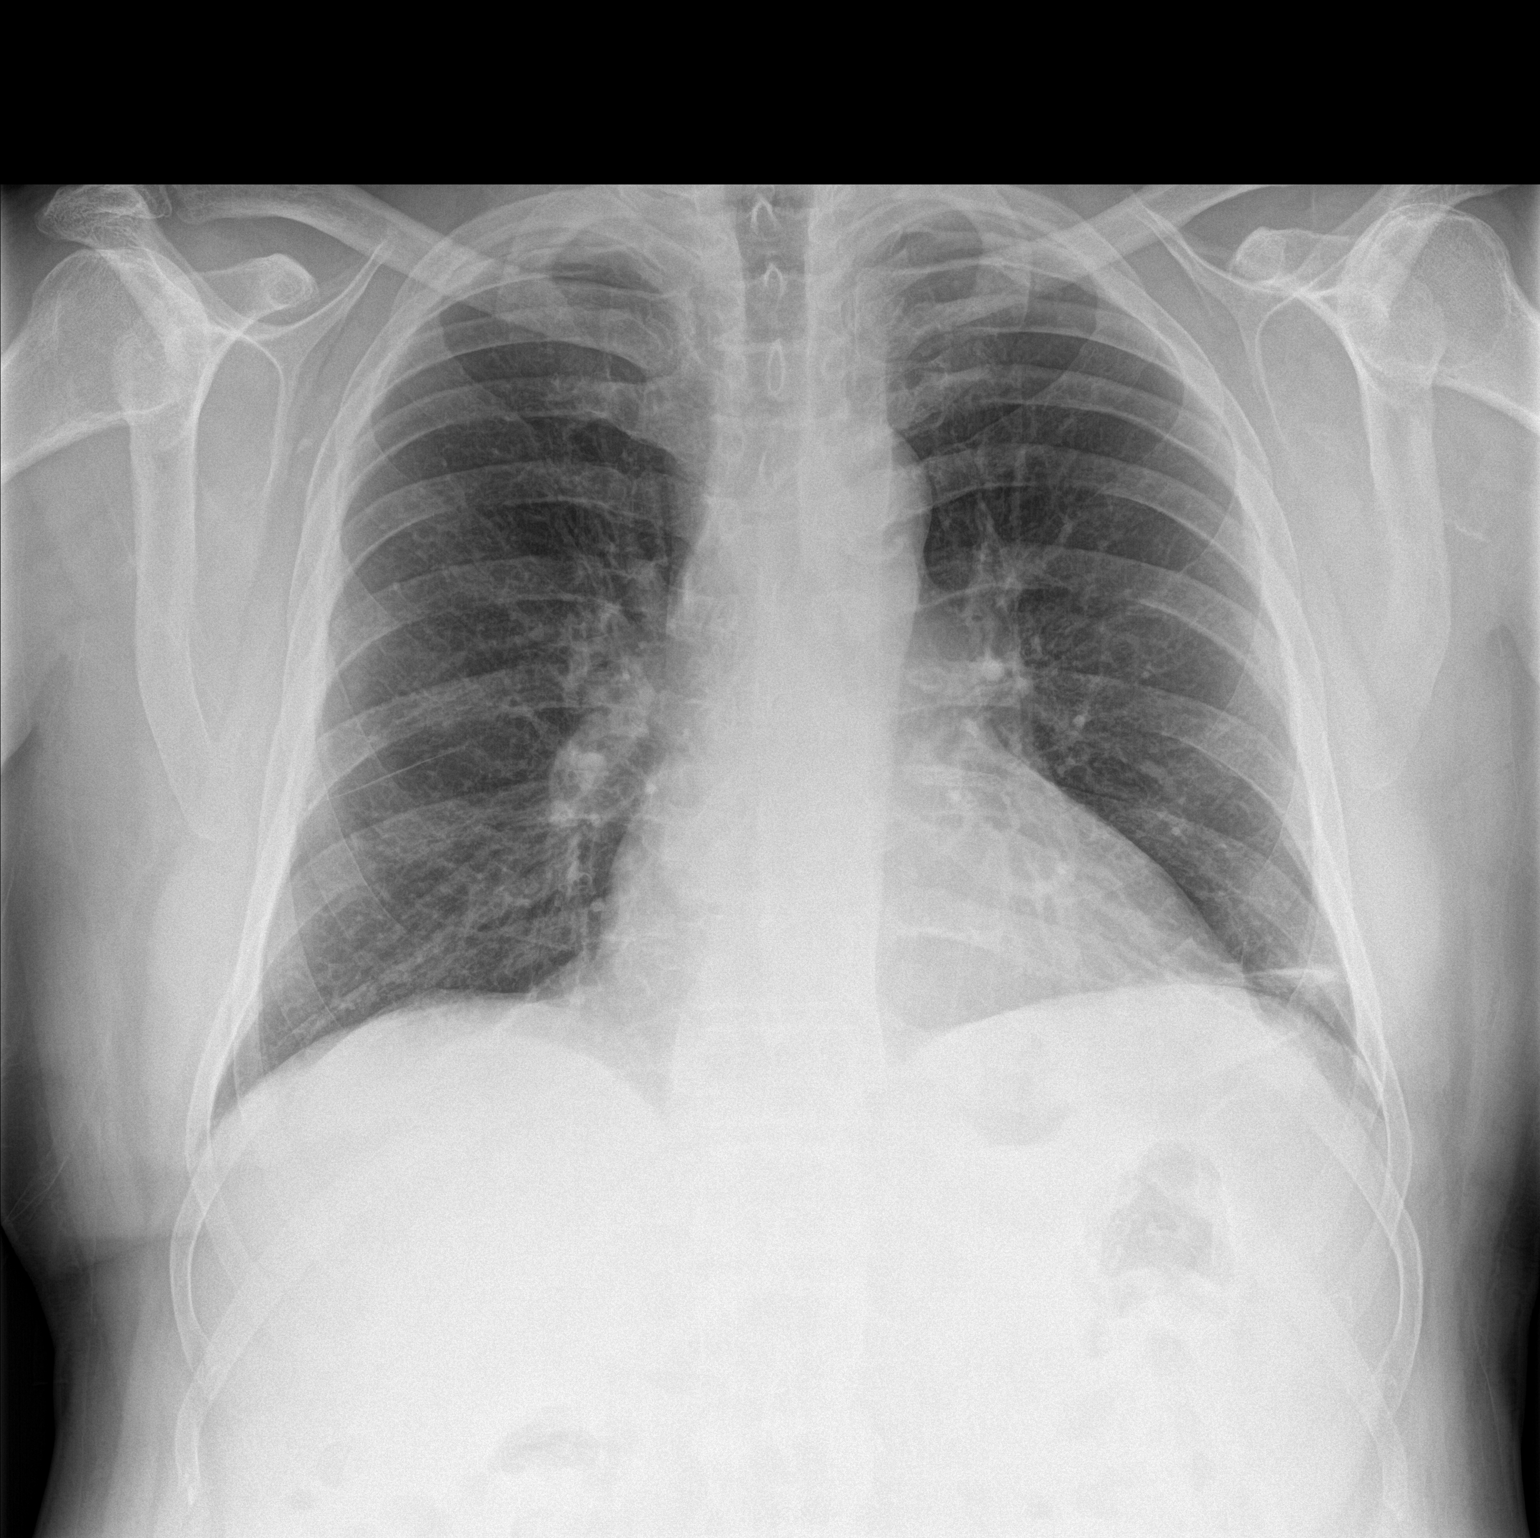
[im 2/2]
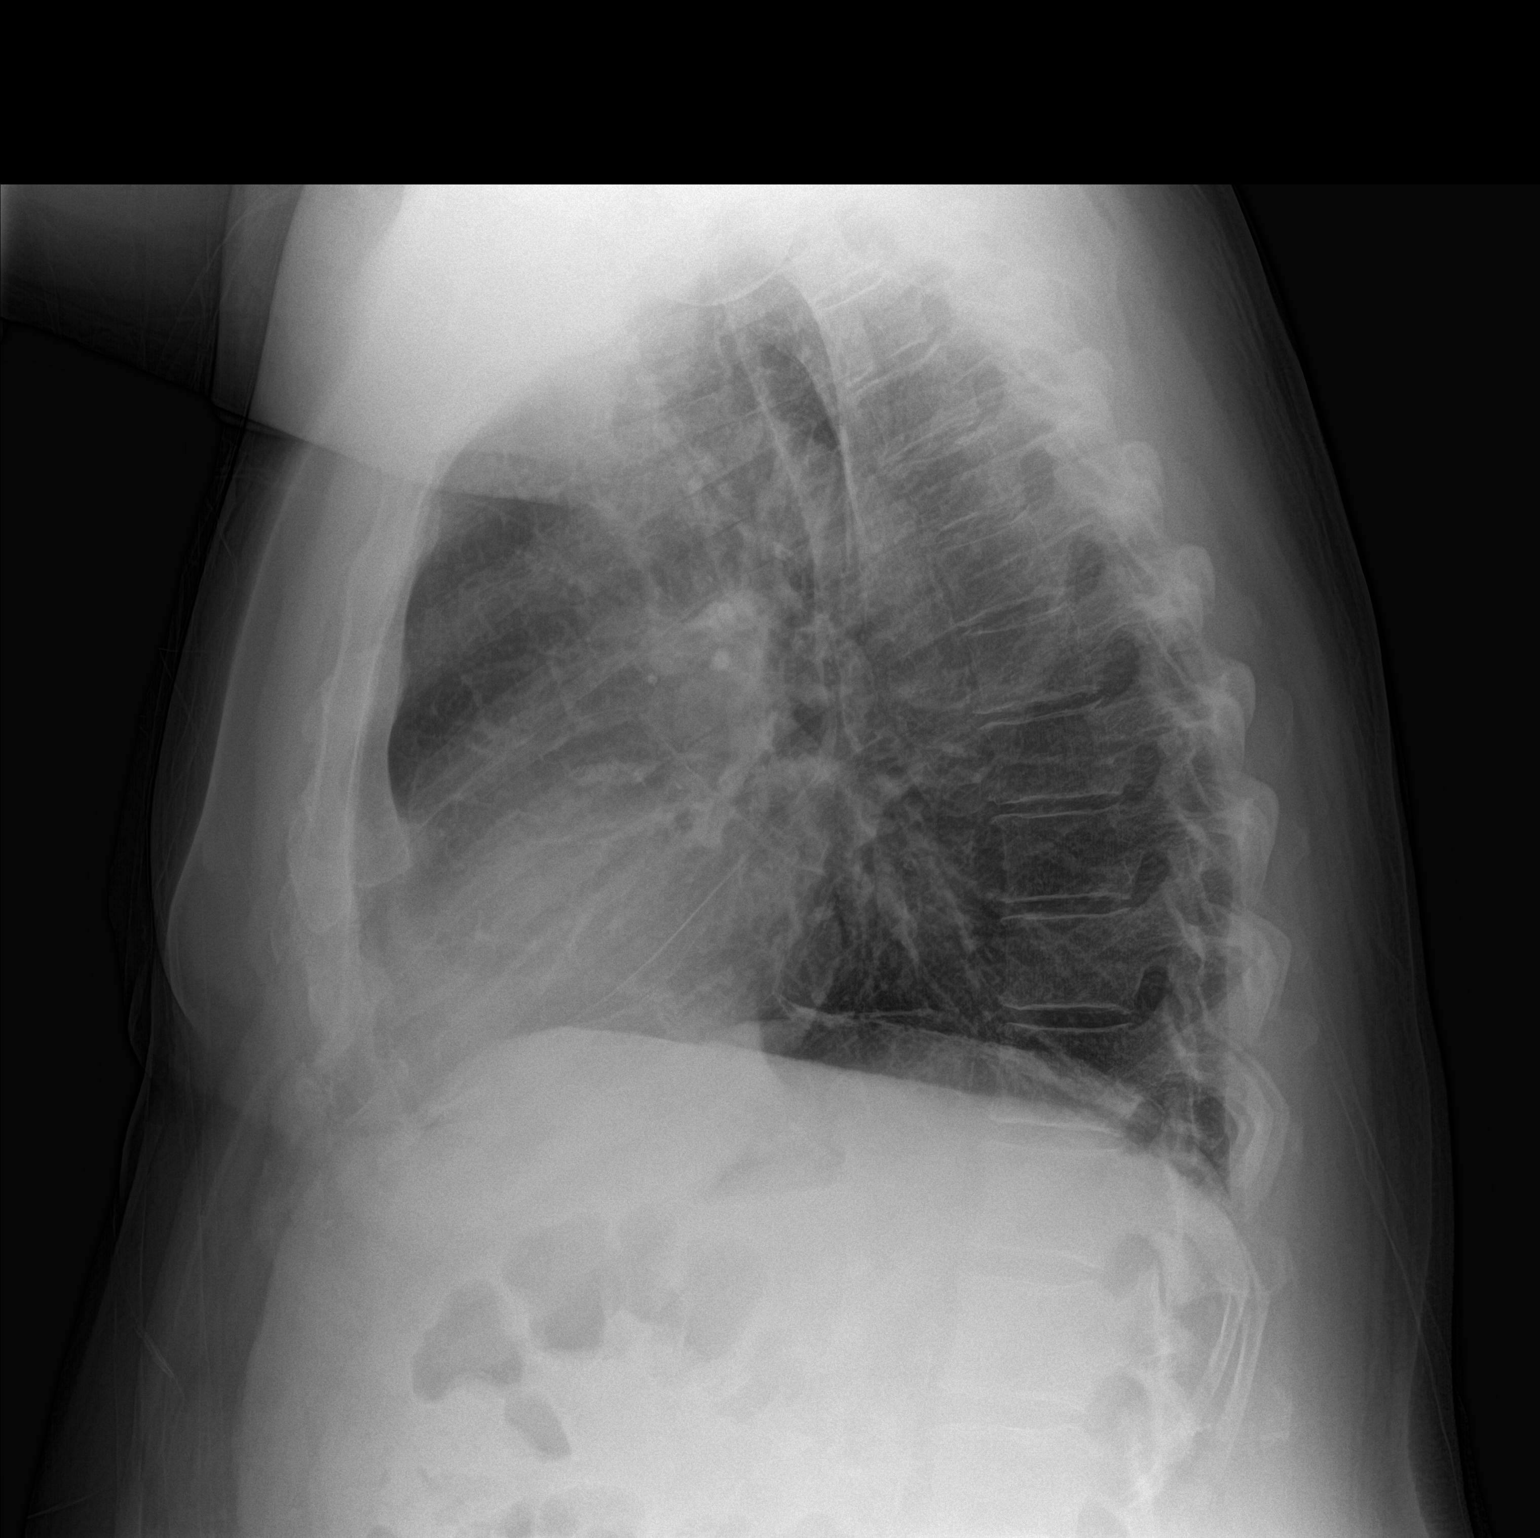

[2 of 2 positions shown; findings below may reference images not displayed]

FINDINGS: There is scarring in the posterolateral left base. Lungs elsewhere
clear. Heart size and pulmonary vascularity are normal. No
adenopathy. No bone lesions.
IMPRESSION: Scarring left base.  No edema or consolidation.

## 2018-03-17 IMAGING — MR MR ABDOMEN WO/W CM
8 of 16 series · 24 of 48 positions shown · IV contrast (8mL MULTIHANCE)
Comparison: 09/19/2014 CT.  06/27/2014 MRI.

CLINICAL DATA: Hepatitis-C. Weight loss. Low energy for 6 months.
Follow-up liver lesion. Cirrhosis.

EXAM:
MRI ABDOMEN WITHOUT AND WITH CONTRAST
TECHNIQUE: Multiplanar multisequence MR imaging of the abdomen was performed
both before and after the administration of intravenous contrast.
CONTRAST:  8 cc MultiHance

[Series 2: T2 · coronal · 7.0mm · 1.25mm/px · 2 of 29 slices shown]
[im 1/29]
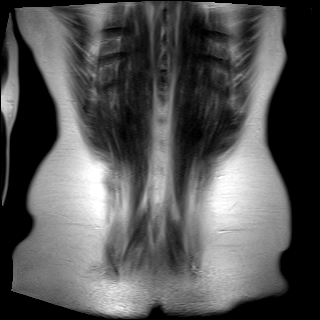
[im 29/29]
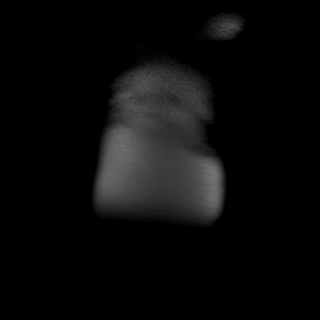

[Series 3: T1 · axial · 8.0mm · 0.74mm/px · z∈[-103,+99]mm · 3 of 44 slices shown (1 of 3)]
[im 1/44]
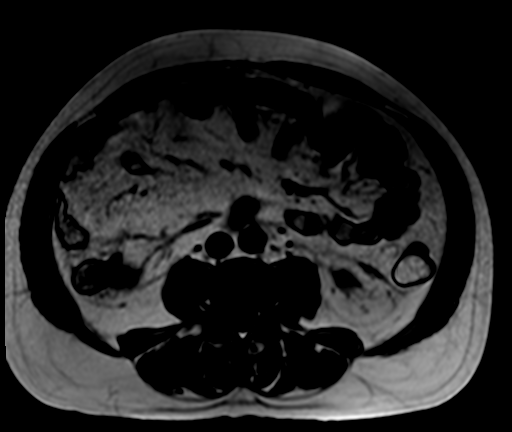
[im 22/44]
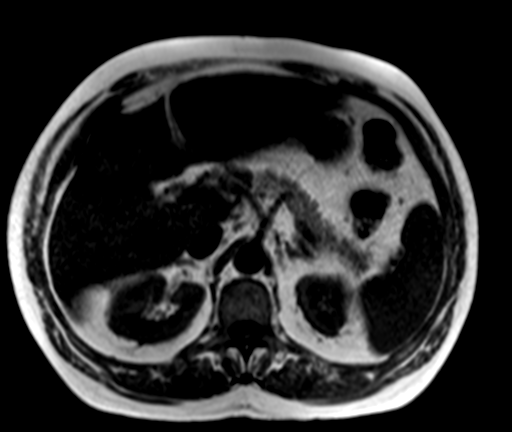
[im 44/44]
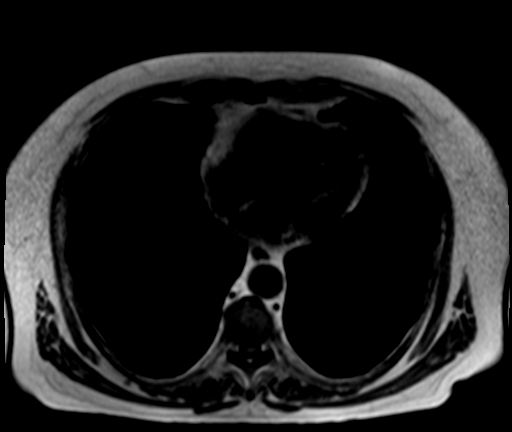

[Series 4: T1 dynamic fat-sat · axial · non-contrast · 3.0mm · 0.86mm/px · z∈[-108,+105]mm · 4 of 72 slices shown]
[im 1/72]
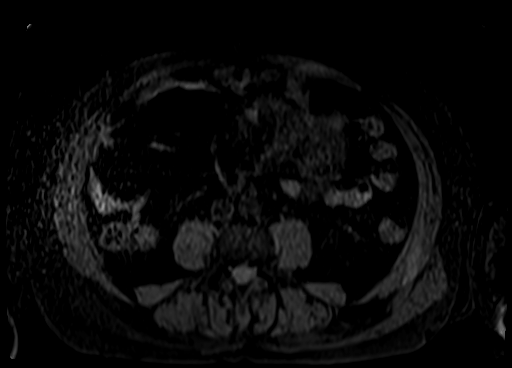
[im 24/72]
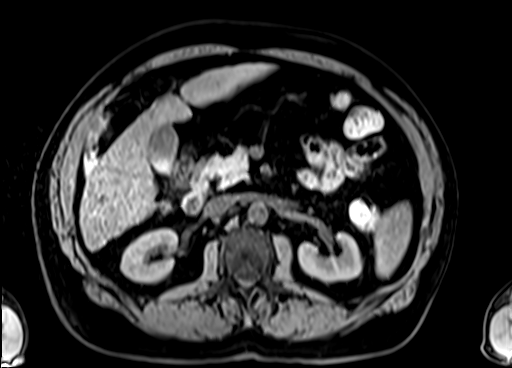
[im 48/72]
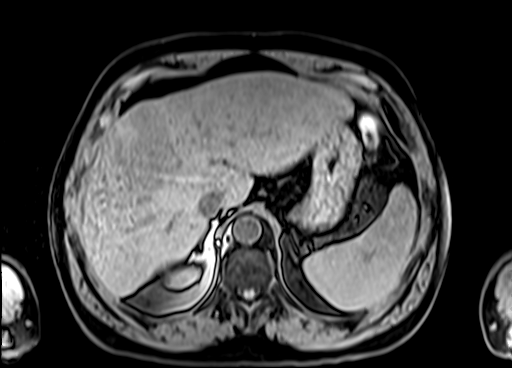
[im 72/72]
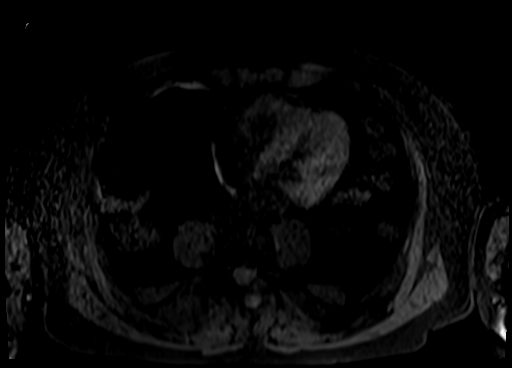

[Series 5: T1 dynamic · axial · 3.0mm · 0.86mm/px · z∈[-108,+105]mm · 4 of 72 slices shown (1 of 3)]
[im 1/72]
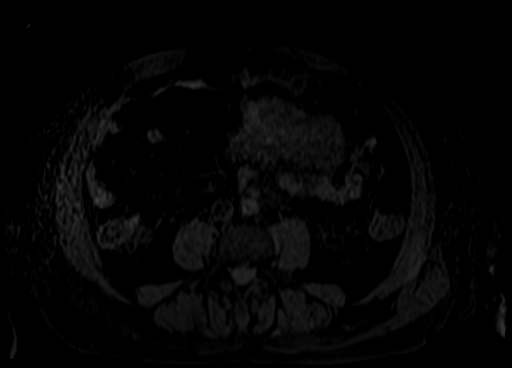
[im 24/72]
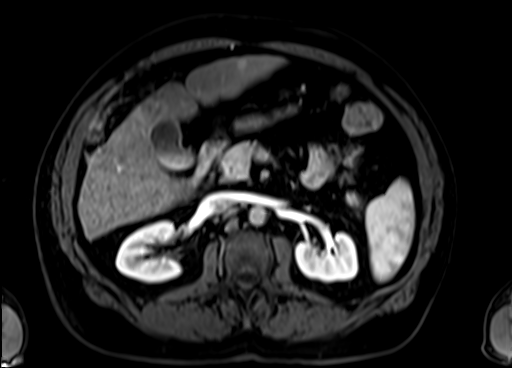
[im 48/72]
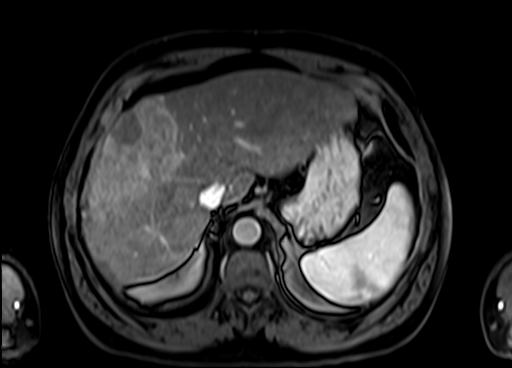
[im 72/72]
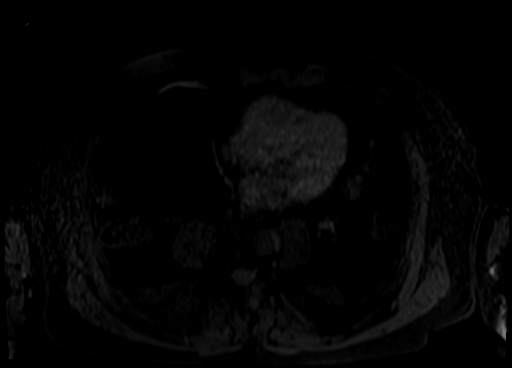

[Series 6: T1 · axial · 3.0mm · 0.86mm/px · z∈[-108,+105]mm · 3 of 72 slices shown (2 of 3)]
[im 1/72]
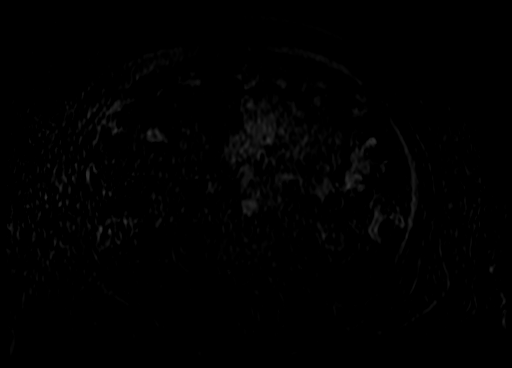
[im 36/72]
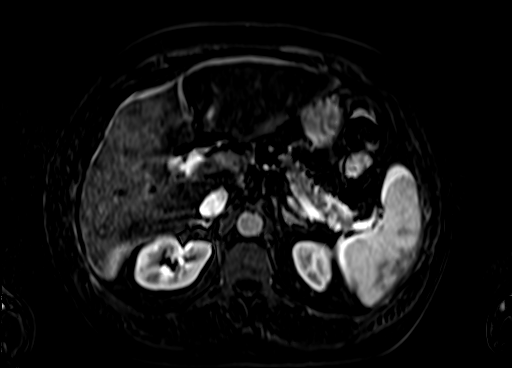
[im 72/72]
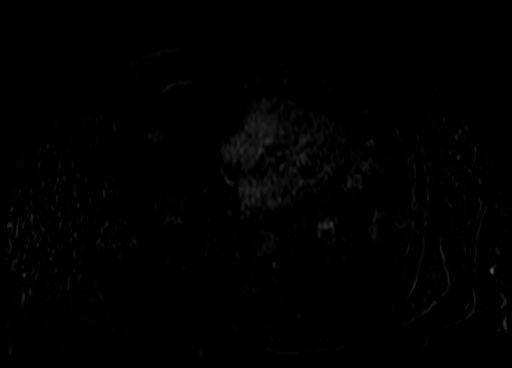

[Series 7: T1 dynamic · axial · 3.0mm · 0.86mm/px · z∈[-108,+105]mm · 3 of 72 slices shown (2 of 3)]
[im 1/72]
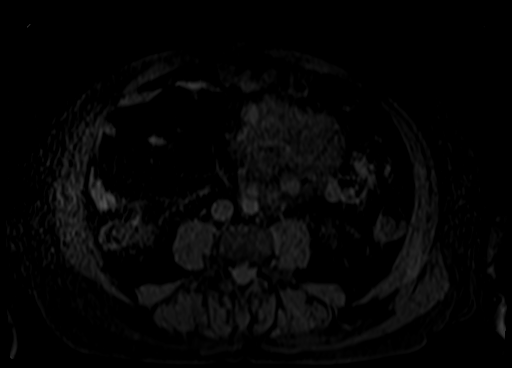
[im 36/72]
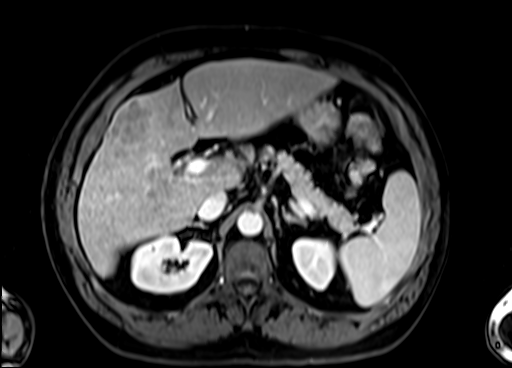
[im 72/72]
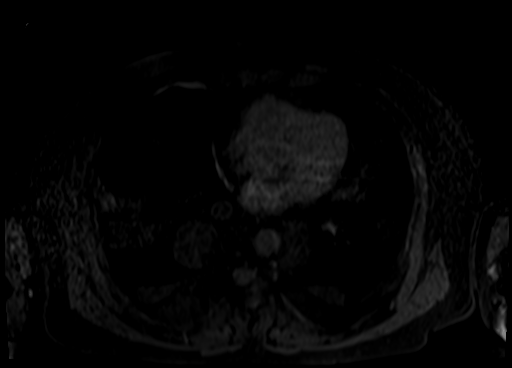

[Series 8: T1 · axial · 3.0mm · 0.86mm/px · z∈[-108,+105]mm · 3 of 72 slices shown (3 of 3)]
[im 1/72]
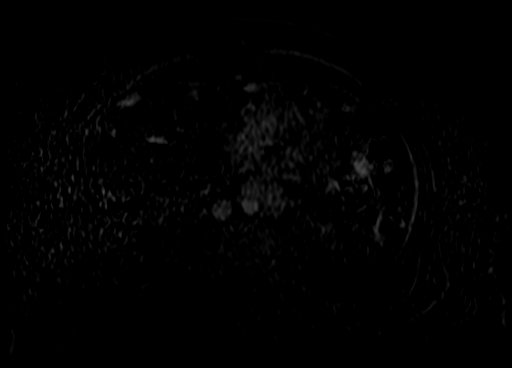
[im 36/72]
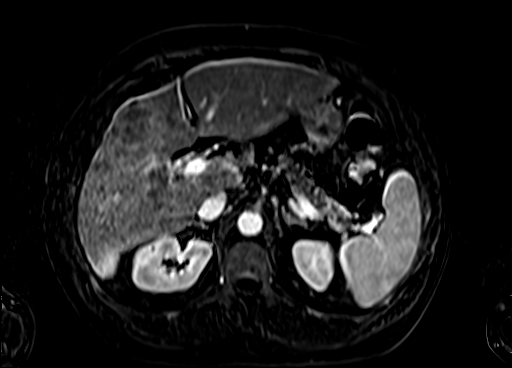
[im 72/72]
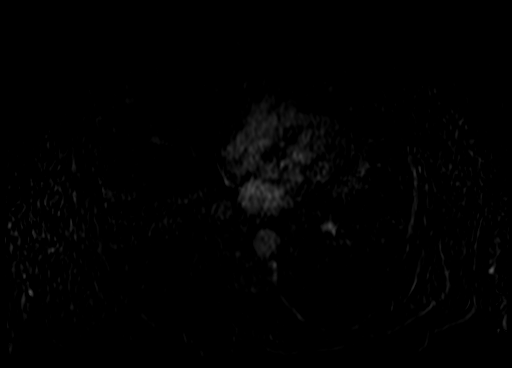

[Series 9: T1 dynamic · axial · 3.0mm · 0.86mm/px · z∈[-108,-3]mm · 2 of 72 slices shown (3 of 3)]
[im 1/72]
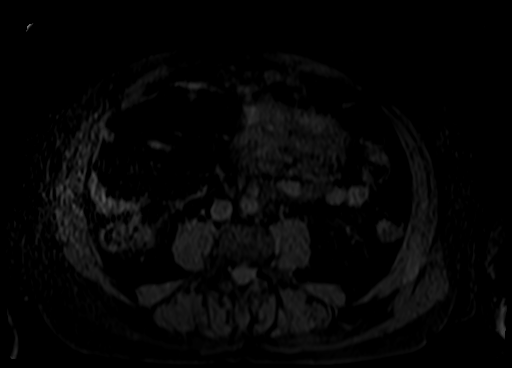
[im 36/72]
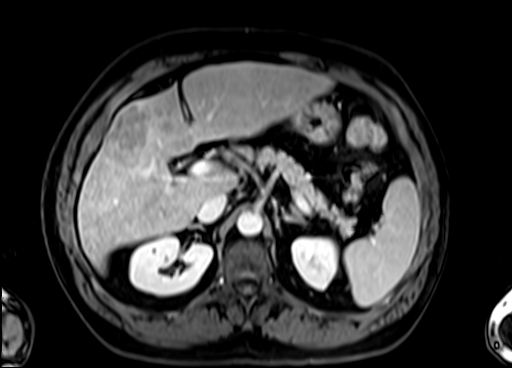

[24 of 48 positions shown; findings below may reference images not displayed]

FINDINGS: Lower chest: Normal heart size without pericardial or pleural
effusion.

Hepatobiliary: Moderate cirrhosis. Development of infiltrative
hepatocellular carcinoma. The most masslike area is within the
hepatic dome, straddling the anterior segment right and medial
segment left lobes. This demonstrates T2 hyperintensity,
heterogeneous arterial phase hyper enhancement and portal venous
phase hypoenhancement. Portal venous "Pseudo capsule" . Measures
x 6.4 cm on image 23/ series 7. Tumor tracks within the anterior
liver with a contiguous masslike area measuring 3.8 x 3.9 cm in
segment 4B on image 37/series 7. Concurrent tumor extension into
right portal vein, primarily its anterior segment. Example image
33/series 9. Signal abnormality of tumor within branches of the
right portal vein to the anterior segment also identified more
superiorly.

Lateral segment left liver lobe focus of precontrast T1
hyperintensity measures 12 mm on image 47/series 4. Suspect mild
arterial phase hyper enhancement in this area on image 48/series 5.
No portal venous phase hypoenhancement identified. Equivocal
restricted diffusion in this area on image 48/series 15.

Normal gallbladder, without biliary ductal dilatation.

Pancreas:  Normal, without mass or ductal dilatation.

Spleen:  Normal in size, without focal abnormality.

Adrenals/Urinary Tract: Normal adrenal glands. Bilateral too small
to characterize renal lesions. No hydronephrosis.

Stomach/Bowel: Normal stomach and abdominal bowel loops.

Vascular/Lymphatic: Aortic and branch vessel atherosclerosis.
Presumed ulcerative plaque within the aorta on image 44/series 7.
Hepatic veins patent. Patent splenic vein and superior mesenteric
vein. No specific evidence of portal venous hypertension. No
retroperitoneal or retrocrural adenopathy.

Other:  No ascites.  No evidence of omental or peritoneal disease.

Musculoskeletal: No acute osseous abnormality.
IMPRESSION: 1. Infiltrative hepatocellular carcinoma within both the anterior
segment right and medial segment left liver lobes. Concurrent tumor
thrombus within the right portal vein and its branches. Imaging
findings in the setting of cirrhosis/chronic HBV are diagnostic of
hepatocellular carcinoma. In not already obtained, multidisciplinary
(surgical, oncological and interventional radiology) consultation is
recommended. References: Kiran, Cleto; American Association
for the Study of Liver Diseases. Management of hepatocellular
carcinoma: an update. Hepatology 2111;53:[id number], and New
[employer] Policy for Liver Transplant Allocation: Standardization of
Liver Imaging, Diagnosis, Classification, and Reporting of
Hepatocellular XarcinomaY Radiology: Volume 266: Number [DATE]
2. No evidence of metastatic disease in the abdomen.
3. Aortic atherosclerosis.
4. Lateral segment left liver lobe 12 mm signal abnormality is
favored to represent a dysplastic nodule. Early hepatocellular
carcinoma cannot be excluded. Consider follow-up at 3 months with
MRI.
These results will be called to the ordering clinician or
representative by the Radiologist Assistant, and communication
documented in the PACS or zVision Dashboard.
